# Patient Record
Sex: Female | Born: 1950
Health system: Southern US, Community
[De-identification: ages and names within clinical notes are randomized; demographics above are authoritative.]

## PROBLEM LIST (undated history)

## (undated) DIAGNOSIS — E079 Disorder of thyroid, unspecified: Secondary | ICD-10-CM

## (undated) DIAGNOSIS — I10 Essential (primary) hypertension: Secondary | ICD-10-CM

## (undated) HISTORY — PX: BREAST BIOPSY: SHX20

## (undated) HISTORY — PX: CHOLECYSTECTOMY: SHX55

---

## 2015-05-23 DIAGNOSIS — M9901 Segmental and somatic dysfunction of cervical region: Secondary | ICD-10-CM | POA: Diagnosis not present

## 2015-05-23 DIAGNOSIS — M9903 Segmental and somatic dysfunction of lumbar region: Secondary | ICD-10-CM | POA: Diagnosis not present

## 2015-05-23 DIAGNOSIS — R51 Headache: Secondary | ICD-10-CM | POA: Diagnosis not present

## 2015-05-23 DIAGNOSIS — M5033 Other cervical disc degeneration, cervicothoracic region: Secondary | ICD-10-CM | POA: Diagnosis not present

## 2015-05-25 DIAGNOSIS — M9901 Segmental and somatic dysfunction of cervical region: Secondary | ICD-10-CM | POA: Diagnosis not present

## 2015-05-25 DIAGNOSIS — M9903 Segmental and somatic dysfunction of lumbar region: Secondary | ICD-10-CM | POA: Diagnosis not present

## 2015-05-25 DIAGNOSIS — R51 Headache: Secondary | ICD-10-CM | POA: Diagnosis not present

## 2015-05-25 DIAGNOSIS — M5033 Other cervical disc degeneration, cervicothoracic region: Secondary | ICD-10-CM | POA: Diagnosis not present

## 2015-05-26 DIAGNOSIS — R51 Headache: Secondary | ICD-10-CM | POA: Diagnosis not present

## 2015-05-26 DIAGNOSIS — M9903 Segmental and somatic dysfunction of lumbar region: Secondary | ICD-10-CM | POA: Diagnosis not present

## 2015-05-26 DIAGNOSIS — M9901 Segmental and somatic dysfunction of cervical region: Secondary | ICD-10-CM | POA: Diagnosis not present

## 2015-05-26 DIAGNOSIS — M5033 Other cervical disc degeneration, cervicothoracic region: Secondary | ICD-10-CM | POA: Diagnosis not present

## 2015-05-30 DIAGNOSIS — M9903 Segmental and somatic dysfunction of lumbar region: Secondary | ICD-10-CM | POA: Diagnosis not present

## 2015-05-30 DIAGNOSIS — R51 Headache: Secondary | ICD-10-CM | POA: Diagnosis not present

## 2015-05-30 DIAGNOSIS — M5033 Other cervical disc degeneration, cervicothoracic region: Secondary | ICD-10-CM | POA: Diagnosis not present

## 2015-05-30 DIAGNOSIS — M9901 Segmental and somatic dysfunction of cervical region: Secondary | ICD-10-CM | POA: Diagnosis not present

## 2015-06-02 DIAGNOSIS — M9903 Segmental and somatic dysfunction of lumbar region: Secondary | ICD-10-CM | POA: Diagnosis not present

## 2015-06-02 DIAGNOSIS — M9901 Segmental and somatic dysfunction of cervical region: Secondary | ICD-10-CM | POA: Diagnosis not present

## 2015-06-02 DIAGNOSIS — R51 Headache: Secondary | ICD-10-CM | POA: Diagnosis not present

## 2015-06-02 DIAGNOSIS — M5033 Other cervical disc degeneration, cervicothoracic region: Secondary | ICD-10-CM | POA: Diagnosis not present

## 2015-06-06 DIAGNOSIS — R51 Headache: Secondary | ICD-10-CM | POA: Diagnosis not present

## 2015-06-06 DIAGNOSIS — M9901 Segmental and somatic dysfunction of cervical region: Secondary | ICD-10-CM | POA: Diagnosis not present

## 2015-06-06 DIAGNOSIS — M5033 Other cervical disc degeneration, cervicothoracic region: Secondary | ICD-10-CM | POA: Diagnosis not present

## 2015-06-06 DIAGNOSIS — M9903 Segmental and somatic dysfunction of lumbar region: Secondary | ICD-10-CM | POA: Diagnosis not present

## 2015-06-08 DIAGNOSIS — R51 Headache: Secondary | ICD-10-CM | POA: Diagnosis not present

## 2015-06-08 DIAGNOSIS — M9901 Segmental and somatic dysfunction of cervical region: Secondary | ICD-10-CM | POA: Diagnosis not present

## 2015-06-08 DIAGNOSIS — M5033 Other cervical disc degeneration, cervicothoracic region: Secondary | ICD-10-CM | POA: Diagnosis not present

## 2015-06-08 DIAGNOSIS — M9903 Segmental and somatic dysfunction of lumbar region: Secondary | ICD-10-CM | POA: Diagnosis not present

## 2015-06-13 DIAGNOSIS — M5033 Other cervical disc degeneration, cervicothoracic region: Secondary | ICD-10-CM | POA: Diagnosis not present

## 2015-06-13 DIAGNOSIS — R51 Headache: Secondary | ICD-10-CM | POA: Diagnosis not present

## 2015-06-13 DIAGNOSIS — M9903 Segmental and somatic dysfunction of lumbar region: Secondary | ICD-10-CM | POA: Diagnosis not present

## 2015-06-13 DIAGNOSIS — M9901 Segmental and somatic dysfunction of cervical region: Secondary | ICD-10-CM | POA: Diagnosis not present

## 2015-06-29 DIAGNOSIS — M5033 Other cervical disc degeneration, cervicothoracic region: Secondary | ICD-10-CM | POA: Diagnosis not present

## 2015-06-29 DIAGNOSIS — M9901 Segmental and somatic dysfunction of cervical region: Secondary | ICD-10-CM | POA: Diagnosis not present

## 2015-06-29 DIAGNOSIS — R51 Headache: Secondary | ICD-10-CM | POA: Diagnosis not present

## 2015-06-29 DIAGNOSIS — M9903 Segmental and somatic dysfunction of lumbar region: Secondary | ICD-10-CM | POA: Diagnosis not present

## 2015-06-30 DIAGNOSIS — R51 Headache: Secondary | ICD-10-CM | POA: Diagnosis not present

## 2015-06-30 DIAGNOSIS — M9903 Segmental and somatic dysfunction of lumbar region: Secondary | ICD-10-CM | POA: Diagnosis not present

## 2015-06-30 DIAGNOSIS — M5033 Other cervical disc degeneration, cervicothoracic region: Secondary | ICD-10-CM | POA: Diagnosis not present

## 2015-06-30 DIAGNOSIS — M9901 Segmental and somatic dysfunction of cervical region: Secondary | ICD-10-CM | POA: Diagnosis not present

## 2015-07-04 DIAGNOSIS — M9901 Segmental and somatic dysfunction of cervical region: Secondary | ICD-10-CM | POA: Diagnosis not present

## 2015-07-04 DIAGNOSIS — M9903 Segmental and somatic dysfunction of lumbar region: Secondary | ICD-10-CM | POA: Diagnosis not present

## 2015-07-04 DIAGNOSIS — M5033 Other cervical disc degeneration, cervicothoracic region: Secondary | ICD-10-CM | POA: Diagnosis not present

## 2015-07-04 DIAGNOSIS — R51 Headache: Secondary | ICD-10-CM | POA: Diagnosis not present

## 2015-07-06 DIAGNOSIS — M5033 Other cervical disc degeneration, cervicothoracic region: Secondary | ICD-10-CM | POA: Diagnosis not present

## 2015-07-06 DIAGNOSIS — M9903 Segmental and somatic dysfunction of lumbar region: Secondary | ICD-10-CM | POA: Diagnosis not present

## 2015-07-06 DIAGNOSIS — R51 Headache: Secondary | ICD-10-CM | POA: Diagnosis not present

## 2015-07-06 DIAGNOSIS — M9901 Segmental and somatic dysfunction of cervical region: Secondary | ICD-10-CM | POA: Diagnosis not present

## 2015-07-08 DIAGNOSIS — M5033 Other cervical disc degeneration, cervicothoracic region: Secondary | ICD-10-CM | POA: Diagnosis not present

## 2015-07-08 DIAGNOSIS — M9903 Segmental and somatic dysfunction of lumbar region: Secondary | ICD-10-CM | POA: Diagnosis not present

## 2015-07-08 DIAGNOSIS — R51 Headache: Secondary | ICD-10-CM | POA: Diagnosis not present

## 2015-07-08 DIAGNOSIS — M9901 Segmental and somatic dysfunction of cervical region: Secondary | ICD-10-CM | POA: Diagnosis not present

## 2015-07-11 DIAGNOSIS — M9903 Segmental and somatic dysfunction of lumbar region: Secondary | ICD-10-CM | POA: Diagnosis not present

## 2015-07-11 DIAGNOSIS — M5033 Other cervical disc degeneration, cervicothoracic region: Secondary | ICD-10-CM | POA: Diagnosis not present

## 2015-07-11 DIAGNOSIS — R51 Headache: Secondary | ICD-10-CM | POA: Diagnosis not present

## 2015-07-11 DIAGNOSIS — M9901 Segmental and somatic dysfunction of cervical region: Secondary | ICD-10-CM | POA: Diagnosis not present

## 2015-07-14 DIAGNOSIS — M9903 Segmental and somatic dysfunction of lumbar region: Secondary | ICD-10-CM | POA: Diagnosis not present

## 2015-07-14 DIAGNOSIS — R51 Headache: Secondary | ICD-10-CM | POA: Diagnosis not present

## 2015-07-14 DIAGNOSIS — M5033 Other cervical disc degeneration, cervicothoracic region: Secondary | ICD-10-CM | POA: Diagnosis not present

## 2015-07-14 DIAGNOSIS — M9901 Segmental and somatic dysfunction of cervical region: Secondary | ICD-10-CM | POA: Diagnosis not present

## 2015-07-19 DIAGNOSIS — R51 Headache: Secondary | ICD-10-CM | POA: Diagnosis not present

## 2015-07-19 DIAGNOSIS — M5033 Other cervical disc degeneration, cervicothoracic region: Secondary | ICD-10-CM | POA: Diagnosis not present

## 2015-07-19 DIAGNOSIS — M9901 Segmental and somatic dysfunction of cervical region: Secondary | ICD-10-CM | POA: Diagnosis not present

## 2015-07-19 DIAGNOSIS — M9903 Segmental and somatic dysfunction of lumbar region: Secondary | ICD-10-CM | POA: Diagnosis not present

## 2015-07-21 DIAGNOSIS — M5033 Other cervical disc degeneration, cervicothoracic region: Secondary | ICD-10-CM | POA: Diagnosis not present

## 2015-07-21 DIAGNOSIS — M9903 Segmental and somatic dysfunction of lumbar region: Secondary | ICD-10-CM | POA: Diagnosis not present

## 2015-07-21 DIAGNOSIS — M9901 Segmental and somatic dysfunction of cervical region: Secondary | ICD-10-CM | POA: Diagnosis not present

## 2015-07-21 DIAGNOSIS — R51 Headache: Secondary | ICD-10-CM | POA: Diagnosis not present

## 2015-07-26 DIAGNOSIS — R51 Headache: Secondary | ICD-10-CM | POA: Diagnosis not present

## 2015-07-26 DIAGNOSIS — M9901 Segmental and somatic dysfunction of cervical region: Secondary | ICD-10-CM | POA: Diagnosis not present

## 2015-07-26 DIAGNOSIS — M5033 Other cervical disc degeneration, cervicothoracic region: Secondary | ICD-10-CM | POA: Diagnosis not present

## 2015-07-26 DIAGNOSIS — M9903 Segmental and somatic dysfunction of lumbar region: Secondary | ICD-10-CM | POA: Diagnosis not present

## 2015-07-28 DIAGNOSIS — M9903 Segmental and somatic dysfunction of lumbar region: Secondary | ICD-10-CM | POA: Diagnosis not present

## 2015-07-28 DIAGNOSIS — M5033 Other cervical disc degeneration, cervicothoracic region: Secondary | ICD-10-CM | POA: Diagnosis not present

## 2015-07-28 DIAGNOSIS — M9901 Segmental and somatic dysfunction of cervical region: Secondary | ICD-10-CM | POA: Diagnosis not present

## 2015-07-28 DIAGNOSIS — R51 Headache: Secondary | ICD-10-CM | POA: Diagnosis not present

## 2015-08-02 DIAGNOSIS — M9901 Segmental and somatic dysfunction of cervical region: Secondary | ICD-10-CM | POA: Diagnosis not present

## 2015-08-02 DIAGNOSIS — R51 Headache: Secondary | ICD-10-CM | POA: Diagnosis not present

## 2015-08-02 DIAGNOSIS — M9903 Segmental and somatic dysfunction of lumbar region: Secondary | ICD-10-CM | POA: Diagnosis not present

## 2015-08-02 DIAGNOSIS — M5033 Other cervical disc degeneration, cervicothoracic region: Secondary | ICD-10-CM | POA: Diagnosis not present

## 2015-08-04 DIAGNOSIS — M9901 Segmental and somatic dysfunction of cervical region: Secondary | ICD-10-CM | POA: Diagnosis not present

## 2015-08-04 DIAGNOSIS — R51 Headache: Secondary | ICD-10-CM | POA: Diagnosis not present

## 2015-08-04 DIAGNOSIS — M5033 Other cervical disc degeneration, cervicothoracic region: Secondary | ICD-10-CM | POA: Diagnosis not present

## 2015-08-04 DIAGNOSIS — M9903 Segmental and somatic dysfunction of lumbar region: Secondary | ICD-10-CM | POA: Diagnosis not present

## 2015-08-09 DIAGNOSIS — R51 Headache: Secondary | ICD-10-CM | POA: Diagnosis not present

## 2015-08-09 DIAGNOSIS — M9901 Segmental and somatic dysfunction of cervical region: Secondary | ICD-10-CM | POA: Diagnosis not present

## 2015-08-09 DIAGNOSIS — M5033 Other cervical disc degeneration, cervicothoracic region: Secondary | ICD-10-CM | POA: Diagnosis not present

## 2015-08-09 DIAGNOSIS — M9903 Segmental and somatic dysfunction of lumbar region: Secondary | ICD-10-CM | POA: Diagnosis not present

## 2015-12-13 DIAGNOSIS — R51 Headache: Secondary | ICD-10-CM | POA: Diagnosis not present

## 2015-12-13 DIAGNOSIS — M9901 Segmental and somatic dysfunction of cervical region: Secondary | ICD-10-CM | POA: Diagnosis not present

## 2015-12-13 DIAGNOSIS — M9903 Segmental and somatic dysfunction of lumbar region: Secondary | ICD-10-CM | POA: Diagnosis not present

## 2015-12-13 DIAGNOSIS — M5033 Other cervical disc degeneration, cervicothoracic region: Secondary | ICD-10-CM | POA: Diagnosis not present

## 2015-12-14 DIAGNOSIS — M62838 Other muscle spasm: Secondary | ICD-10-CM | POA: Diagnosis not present

## 2015-12-14 DIAGNOSIS — M9901 Segmental and somatic dysfunction of cervical region: Secondary | ICD-10-CM | POA: Diagnosis not present

## 2015-12-14 DIAGNOSIS — M9903 Segmental and somatic dysfunction of lumbar region: Secondary | ICD-10-CM | POA: Diagnosis not present

## 2015-12-14 DIAGNOSIS — R51 Headache: Secondary | ICD-10-CM | POA: Diagnosis not present

## 2015-12-14 DIAGNOSIS — M5416 Radiculopathy, lumbar region: Secondary | ICD-10-CM | POA: Diagnosis not present

## 2015-12-14 DIAGNOSIS — M5033 Other cervical disc degeneration, cervicothoracic region: Secondary | ICD-10-CM | POA: Diagnosis not present

## 2015-12-21 DIAGNOSIS — M9903 Segmental and somatic dysfunction of lumbar region: Secondary | ICD-10-CM | POA: Diagnosis not present

## 2015-12-21 DIAGNOSIS — M5441 Lumbago with sciatica, right side: Secondary | ICD-10-CM | POA: Diagnosis not present

## 2015-12-23 DIAGNOSIS — M9903 Segmental and somatic dysfunction of lumbar region: Secondary | ICD-10-CM | POA: Diagnosis not present

## 2015-12-23 DIAGNOSIS — M5441 Lumbago with sciatica, right side: Secondary | ICD-10-CM | POA: Diagnosis not present

## 2015-12-26 DIAGNOSIS — M25551 Pain in right hip: Secondary | ICD-10-CM | POA: Diagnosis not present

## 2015-12-26 DIAGNOSIS — M5441 Lumbago with sciatica, right side: Secondary | ICD-10-CM | POA: Diagnosis not present

## 2015-12-26 DIAGNOSIS — M9903 Segmental and somatic dysfunction of lumbar region: Secondary | ICD-10-CM | POA: Diagnosis not present

## 2015-12-28 DIAGNOSIS — M5441 Lumbago with sciatica, right side: Secondary | ICD-10-CM | POA: Diagnosis not present

## 2015-12-28 DIAGNOSIS — M9903 Segmental and somatic dysfunction of lumbar region: Secondary | ICD-10-CM | POA: Diagnosis not present

## 2015-12-28 DIAGNOSIS — M25551 Pain in right hip: Secondary | ICD-10-CM | POA: Diagnosis not present

## 2016-01-02 DIAGNOSIS — M25551 Pain in right hip: Secondary | ICD-10-CM | POA: Diagnosis not present

## 2016-01-02 DIAGNOSIS — M5441 Lumbago with sciatica, right side: Secondary | ICD-10-CM | POA: Diagnosis not present

## 2016-01-02 DIAGNOSIS — M9903 Segmental and somatic dysfunction of lumbar region: Secondary | ICD-10-CM | POA: Diagnosis not present

## 2016-01-04 DIAGNOSIS — M5441 Lumbago with sciatica, right side: Secondary | ICD-10-CM | POA: Diagnosis not present

## 2016-01-04 DIAGNOSIS — M9903 Segmental and somatic dysfunction of lumbar region: Secondary | ICD-10-CM | POA: Diagnosis not present

## 2016-01-04 DIAGNOSIS — M25551 Pain in right hip: Secondary | ICD-10-CM | POA: Diagnosis not present

## 2016-01-11 DIAGNOSIS — M9903 Segmental and somatic dysfunction of lumbar region: Secondary | ICD-10-CM | POA: Diagnosis not present

## 2016-01-11 DIAGNOSIS — M5441 Lumbago with sciatica, right side: Secondary | ICD-10-CM | POA: Diagnosis not present

## 2016-01-11 DIAGNOSIS — M25551 Pain in right hip: Secondary | ICD-10-CM | POA: Diagnosis not present

## 2016-01-18 DIAGNOSIS — M5441 Lumbago with sciatica, right side: Secondary | ICD-10-CM | POA: Diagnosis not present

## 2016-01-18 DIAGNOSIS — M25551 Pain in right hip: Secondary | ICD-10-CM | POA: Diagnosis not present

## 2016-01-18 DIAGNOSIS — M9903 Segmental and somatic dysfunction of lumbar region: Secondary | ICD-10-CM | POA: Diagnosis not present

## 2016-01-23 DIAGNOSIS — M25551 Pain in right hip: Secondary | ICD-10-CM | POA: Diagnosis not present

## 2016-01-23 DIAGNOSIS — M9903 Segmental and somatic dysfunction of lumbar region: Secondary | ICD-10-CM | POA: Diagnosis not present

## 2016-01-23 DIAGNOSIS — M5441 Lumbago with sciatica, right side: Secondary | ICD-10-CM | POA: Diagnosis not present

## 2016-01-24 DIAGNOSIS — M9903 Segmental and somatic dysfunction of lumbar region: Secondary | ICD-10-CM | POA: Diagnosis not present

## 2016-01-24 DIAGNOSIS — M25551 Pain in right hip: Secondary | ICD-10-CM | POA: Diagnosis not present

## 2016-01-24 DIAGNOSIS — M5441 Lumbago with sciatica, right side: Secondary | ICD-10-CM | POA: Diagnosis not present

## 2016-01-25 DIAGNOSIS — M9903 Segmental and somatic dysfunction of lumbar region: Secondary | ICD-10-CM | POA: Diagnosis not present

## 2016-01-25 DIAGNOSIS — M5441 Lumbago with sciatica, right side: Secondary | ICD-10-CM | POA: Diagnosis not present

## 2016-01-25 DIAGNOSIS — M25551 Pain in right hip: Secondary | ICD-10-CM | POA: Diagnosis not present

## 2016-01-27 DIAGNOSIS — M5441 Lumbago with sciatica, right side: Secondary | ICD-10-CM | POA: Diagnosis not present

## 2016-01-27 DIAGNOSIS — M25551 Pain in right hip: Secondary | ICD-10-CM | POA: Diagnosis not present

## 2016-01-27 DIAGNOSIS — M9903 Segmental and somatic dysfunction of lumbar region: Secondary | ICD-10-CM | POA: Diagnosis not present

## 2016-01-30 DIAGNOSIS — M5441 Lumbago with sciatica, right side: Secondary | ICD-10-CM | POA: Diagnosis not present

## 2016-01-30 DIAGNOSIS — M25551 Pain in right hip: Secondary | ICD-10-CM | POA: Diagnosis not present

## 2016-01-30 DIAGNOSIS — M9903 Segmental and somatic dysfunction of lumbar region: Secondary | ICD-10-CM | POA: Diagnosis not present

## 2016-02-01 DIAGNOSIS — M545 Low back pain: Secondary | ICD-10-CM | POA: Diagnosis not present

## 2016-02-01 DIAGNOSIS — Z1389 Encounter for screening for other disorder: Secondary | ICD-10-CM | POA: Diagnosis not present

## 2016-02-01 DIAGNOSIS — Z789 Other specified health status: Secondary | ICD-10-CM | POA: Diagnosis not present

## 2016-02-01 DIAGNOSIS — R7309 Other abnormal glucose: Secondary | ICD-10-CM | POA: Diagnosis not present

## 2016-02-01 DIAGNOSIS — M5441 Lumbago with sciatica, right side: Secondary | ICD-10-CM | POA: Diagnosis not present

## 2016-02-01 DIAGNOSIS — M9903 Segmental and somatic dysfunction of lumbar region: Secondary | ICD-10-CM | POA: Diagnosis not present

## 2016-02-01 DIAGNOSIS — M25551 Pain in right hip: Secondary | ICD-10-CM | POA: Diagnosis not present

## 2016-02-28 DIAGNOSIS — M5441 Lumbago with sciatica, right side: Secondary | ICD-10-CM | POA: Diagnosis not present

## 2016-02-28 DIAGNOSIS — M25551 Pain in right hip: Secondary | ICD-10-CM | POA: Diagnosis not present

## 2016-02-28 DIAGNOSIS — M9903 Segmental and somatic dysfunction of lumbar region: Secondary | ICD-10-CM | POA: Diagnosis not present

## 2016-02-29 DIAGNOSIS — M533 Sacrococcygeal disorders, not elsewhere classified: Secondary | ICD-10-CM | POA: Diagnosis not present

## 2016-02-29 DIAGNOSIS — M5126 Other intervertebral disc displacement, lumbar region: Secondary | ICD-10-CM | POA: Diagnosis not present

## 2016-02-29 DIAGNOSIS — M9973 Connective tissue and disc stenosis of intervertebral foramina of lumbar region: Secondary | ICD-10-CM | POA: Diagnosis not present

## 2016-02-29 DIAGNOSIS — M545 Low back pain: Secondary | ICD-10-CM | POA: Diagnosis not present

## 2016-03-01 DIAGNOSIS — R7309 Other abnormal glucose: Secondary | ICD-10-CM | POA: Diagnosis not present

## 2016-03-01 DIAGNOSIS — Z789 Other specified health status: Secondary | ICD-10-CM | POA: Diagnosis not present

## 2016-03-01 DIAGNOSIS — I10 Essential (primary) hypertension: Secondary | ICD-10-CM | POA: Diagnosis not present

## 2016-03-01 DIAGNOSIS — Z0000 Encounter for general adult medical examination without abnormal findings: Secondary | ICD-10-CM | POA: Diagnosis not present

## 2016-03-01 DIAGNOSIS — E785 Hyperlipidemia, unspecified: Secondary | ICD-10-CM | POA: Diagnosis not present

## 2016-03-01 DIAGNOSIS — E039 Hypothyroidism, unspecified: Secondary | ICD-10-CM | POA: Diagnosis not present

## 2016-03-01 DIAGNOSIS — E119 Type 2 diabetes mellitus without complications: Secondary | ICD-10-CM | POA: Diagnosis not present

## 2016-04-04 DIAGNOSIS — M5136 Other intervertebral disc degeneration, lumbar region: Secondary | ICD-10-CM | POA: Diagnosis not present

## 2016-04-04 DIAGNOSIS — M5416 Radiculopathy, lumbar region: Secondary | ICD-10-CM | POA: Diagnosis not present

## 2016-04-13 DIAGNOSIS — M5441 Lumbago with sciatica, right side: Secondary | ICD-10-CM | POA: Diagnosis not present

## 2016-04-13 DIAGNOSIS — M25551 Pain in right hip: Secondary | ICD-10-CM | POA: Diagnosis not present

## 2016-04-13 DIAGNOSIS — M9903 Segmental and somatic dysfunction of lumbar region: Secondary | ICD-10-CM | POA: Diagnosis not present

## 2016-05-09 DIAGNOSIS — M5136 Other intervertebral disc degeneration, lumbar region: Secondary | ICD-10-CM | POA: Diagnosis not present

## 2016-05-09 DIAGNOSIS — M5416 Radiculopathy, lumbar region: Secondary | ICD-10-CM | POA: Diagnosis not present

## 2016-06-25 DIAGNOSIS — J069 Acute upper respiratory infection, unspecified: Secondary | ICD-10-CM | POA: Diagnosis not present

## 2016-06-25 DIAGNOSIS — R7309 Other abnormal glucose: Secondary | ICD-10-CM | POA: Diagnosis not present

## 2016-11-07 DIAGNOSIS — M9903 Segmental and somatic dysfunction of lumbar region: Secondary | ICD-10-CM | POA: Diagnosis not present

## 2016-11-07 DIAGNOSIS — M5441 Lumbago with sciatica, right side: Secondary | ICD-10-CM | POA: Diagnosis not present

## 2016-11-07 DIAGNOSIS — M545 Low back pain: Secondary | ICD-10-CM | POA: Diagnosis not present

## 2016-11-07 DIAGNOSIS — M25551 Pain in right hip: Secondary | ICD-10-CM | POA: Diagnosis not present

## 2016-11-12 DIAGNOSIS — M545 Low back pain: Secondary | ICD-10-CM | POA: Diagnosis not present

## 2016-11-12 DIAGNOSIS — M9903 Segmental and somatic dysfunction of lumbar region: Secondary | ICD-10-CM | POA: Diagnosis not present

## 2016-12-04 DIAGNOSIS — M9903 Segmental and somatic dysfunction of lumbar region: Secondary | ICD-10-CM | POA: Diagnosis not present

## 2016-12-04 DIAGNOSIS — M5137 Other intervertebral disc degeneration, lumbosacral region: Secondary | ICD-10-CM | POA: Diagnosis not present

## 2016-12-10 DIAGNOSIS — M9903 Segmental and somatic dysfunction of lumbar region: Secondary | ICD-10-CM | POA: Diagnosis not present

## 2016-12-10 DIAGNOSIS — M5137 Other intervertebral disc degeneration, lumbosacral region: Secondary | ICD-10-CM | POA: Diagnosis not present

## 2016-12-10 DIAGNOSIS — M436 Torticollis: Secondary | ICD-10-CM | POA: Diagnosis not present

## 2016-12-10 DIAGNOSIS — M9901 Segmental and somatic dysfunction of cervical region: Secondary | ICD-10-CM | POA: Diagnosis not present

## 2017-03-22 DIAGNOSIS — E785 Hyperlipidemia, unspecified: Secondary | ICD-10-CM | POA: Diagnosis not present

## 2017-03-22 DIAGNOSIS — E119 Type 2 diabetes mellitus without complications: Secondary | ICD-10-CM | POA: Diagnosis not present

## 2017-03-22 DIAGNOSIS — R945 Abnormal results of liver function studies: Secondary | ICD-10-CM | POA: Diagnosis not present

## 2017-07-15 DIAGNOSIS — M5416 Radiculopathy, lumbar region: Secondary | ICD-10-CM | POA: Diagnosis not present

## 2017-07-15 DIAGNOSIS — M5136 Other intervertebral disc degeneration, lumbar region: Secondary | ICD-10-CM | POA: Diagnosis not present

## 2017-09-09 DIAGNOSIS — H3562 Retinal hemorrhage, left eye: Secondary | ICD-10-CM | POA: Diagnosis not present

## 2017-12-02 DIAGNOSIS — S39012A Strain of muscle, fascia and tendon of lower back, initial encounter: Secondary | ICD-10-CM | POA: Diagnosis not present

## 2017-12-02 DIAGNOSIS — M25511 Pain in right shoulder: Secondary | ICD-10-CM | POA: Diagnosis not present

## 2017-12-02 DIAGNOSIS — M9903 Segmental and somatic dysfunction of lumbar region: Secondary | ICD-10-CM | POA: Diagnosis not present

## 2018-03-19 DIAGNOSIS — M9903 Segmental and somatic dysfunction of lumbar region: Secondary | ICD-10-CM | POA: Diagnosis not present

## 2018-03-19 DIAGNOSIS — M5441 Lumbago with sciatica, right side: Secondary | ICD-10-CM | POA: Diagnosis not present

## 2018-03-28 DIAGNOSIS — M5441 Lumbago with sciatica, right side: Secondary | ICD-10-CM | POA: Diagnosis not present

## 2018-03-28 DIAGNOSIS — M9903 Segmental and somatic dysfunction of lumbar region: Secondary | ICD-10-CM | POA: Diagnosis not present

## 2018-05-13 DIAGNOSIS — E785 Hyperlipidemia, unspecified: Secondary | ICD-10-CM | POA: Diagnosis not present

## 2018-05-13 DIAGNOSIS — E039 Hypothyroidism, unspecified: Secondary | ICD-10-CM | POA: Diagnosis not present

## 2018-05-13 DIAGNOSIS — I10 Essential (primary) hypertension: Secondary | ICD-10-CM | POA: Diagnosis not present

## 2018-05-13 DIAGNOSIS — E119 Type 2 diabetes mellitus without complications: Secondary | ICD-10-CM | POA: Diagnosis not present

## 2018-05-17 ENCOUNTER — Emergency Department
Admission: EM | Admit: 2018-05-17 | Discharge: 2018-05-17 | Disposition: A | Discharge status: Home / Self Care | Payer: PPO | Attending: Emergency Medicine | Admitting: Emergency Medicine

## 2018-05-17 ENCOUNTER — Other Ambulatory Visit: Payer: Self-pay

## 2018-05-17 ENCOUNTER — Emergency Department: Payer: PPO

## 2018-05-17 ENCOUNTER — Encounter: Payer: Self-pay | Admitting: Emergency Medicine

## 2018-05-17 DIAGNOSIS — M5489 Other dorsalgia: Secondary | ICD-10-CM | POA: Diagnosis not present

## 2018-05-17 DIAGNOSIS — M545 Low back pain, unspecified: Secondary | ICD-10-CM

## 2018-05-17 DIAGNOSIS — R52 Pain, unspecified: Secondary | ICD-10-CM | POA: Diagnosis not present

## 2018-05-17 DIAGNOSIS — S3992XA Unspecified injury of lower back, initial encounter: Secondary | ICD-10-CM | POA: Diagnosis not present

## 2018-05-17 DIAGNOSIS — Z87891 Personal history of nicotine dependence: Secondary | ICD-10-CM | POA: Diagnosis not present

## 2018-05-17 DIAGNOSIS — M25551 Pain in right hip: Secondary | ICD-10-CM | POA: Diagnosis not present

## 2018-05-17 DIAGNOSIS — S79911A Unspecified injury of right hip, initial encounter: Secondary | ICD-10-CM | POA: Diagnosis not present

## 2018-05-17 DIAGNOSIS — E119 Type 2 diabetes mellitus without complications: Secondary | ICD-10-CM | POA: Diagnosis not present

## 2018-05-17 DIAGNOSIS — I10 Essential (primary) hypertension: Secondary | ICD-10-CM | POA: Diagnosis not present

## 2018-05-17 HISTORY — DX: Disorder of thyroid, unspecified: E07.9

## 2018-05-17 HISTORY — DX: Essential (primary) hypertension: I10

## 2018-05-17 LAB — BASIC METABOLIC PANEL
Anion gap: 8 (ref 5–15)
BUN: 21 mg/dL (ref 8–23)
CO2: 26 mmol/L (ref 22–32)
CREATININE: 1.24 mg/dL — AB (ref 0.44–1.00)
Calcium: 9.8 mg/dL (ref 8.9–10.3)
Chloride: 103 mmol/L (ref 98–111)
GFR calc Af Amer: 52 mL/min — ABNORMAL LOW (ref >60)
GFR calc non Af Amer: 45 mL/min — ABNORMAL LOW (ref >60)
Glucose, Bld: 113 mg/dL — ABNORMAL HIGH (ref 70–99)
Potassium: 3.7 mmol/L (ref 3.5–5.1)
Sodium: 137 mmol/L (ref 135–145)

## 2018-05-17 MED ORDER — MORPHINE SULFATE (PF) 4 MG/ML IV SOLN
4.0000 mg | Freq: Once | INTRAVENOUS | Status: AC
  Administered 2018-05-17: 4 mg via INTRAVENOUS
  Filled 2018-05-17: qty 1

## 2018-05-17 MED ORDER — ONDANSETRON HCL 4 MG/2ML IJ SOLN
4.0000 mg | Freq: Once | INTRAMUSCULAR | Status: AC
  Administered 2018-05-17: 4 mg via INTRAVENOUS
  Filled 2018-05-17: qty 2

## 2018-05-17 NOTE — ED Triage Notes (Signed)
Pt states in [place] prior to christmas with back pain. Last night while getting off couch she twisted and has right sided back pain. Has appt on 20th with ortho?. On gabapentin for the pain, took that and ibuprofen this am

## 2018-05-17 NOTE — Discharge Instructions (Addendum)
We discussed do not rest in bed.  Get up and move around although take it easy and be careful.  The x-rays show no fractures or any other acute problem.  Use the Tylenol extra strength 1 pill 4 times a day.  Increase the gabapentin to 1 pill 3 times a day.  After 4 days you can increase it further to 2 pills in the morning 1 pill at noon and 2 pills at night.  Careful can make you woozy.  Your kidney function is just slightly below normal.  Because of this I would limit the Motrin to 3 pills twice a day for only 2 days today and tomorrow.  After that stop the Motrin.  Please return for increasing pain or weakness.  Please follow-up with your doctor as planned.
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
## 2018-05-17 NOTE — ED Triage Notes (Signed)
Pt states she was bending over to give her dog something, and when the dog lunged at her she jerked back.

## 2018-05-26 DIAGNOSIS — M5416 Radiculopathy, lumbar region: Secondary | ICD-10-CM | POA: Diagnosis not present

## 2018-05-26 DIAGNOSIS — M5126 Other intervertebral disc displacement, lumbar region: Secondary | ICD-10-CM | POA: Diagnosis not present

## 2018-05-27 DIAGNOSIS — M9903 Segmental and somatic dysfunction of lumbar region: Secondary | ICD-10-CM | POA: Diagnosis not present

## 2018-05-27 DIAGNOSIS — S335XXA Sprain of ligaments of lumbar spine, initial encounter: Secondary | ICD-10-CM | POA: Diagnosis not present

## 2018-05-30 DIAGNOSIS — M9903 Segmental and somatic dysfunction of lumbar region: Secondary | ICD-10-CM | POA: Diagnosis not present

## 2018-05-30 DIAGNOSIS — S335XXA Sprain of ligaments of lumbar spine, initial encounter: Secondary | ICD-10-CM | POA: Diagnosis not present

## 2018-06-06 DIAGNOSIS — M5137 Other intervertebral disc degeneration, lumbosacral region: Secondary | ICD-10-CM | POA: Diagnosis not present

## 2018-06-06 DIAGNOSIS — M9901 Segmental and somatic dysfunction of cervical region: Secondary | ICD-10-CM | POA: Diagnosis not present

## 2018-06-06 DIAGNOSIS — M531 Cervicobrachial syndrome: Secondary | ICD-10-CM | POA: Diagnosis not present

## 2018-06-06 DIAGNOSIS — M9903 Segmental and somatic dysfunction of lumbar region: Secondary | ICD-10-CM | POA: Diagnosis not present

## 2018-06-11 DIAGNOSIS — M9903 Segmental and somatic dysfunction of lumbar region: Secondary | ICD-10-CM | POA: Diagnosis not present

## 2018-06-11 DIAGNOSIS — M5137 Other intervertebral disc degeneration, lumbosacral region: Secondary | ICD-10-CM | POA: Diagnosis not present

## 2018-08-01 ENCOUNTER — Other Ambulatory Visit: Payer: Self-pay | Admitting: Internal Medicine

## 2018-08-01 DIAGNOSIS — E118 Type 2 diabetes mellitus with unspecified complications: Secondary | ICD-10-CM | POA: Diagnosis not present

## 2018-08-01 DIAGNOSIS — J9811 Atelectasis: Secondary | ICD-10-CM | POA: Diagnosis not present

## 2018-08-01 DIAGNOSIS — Z1239 Encounter for other screening for malignant neoplasm of breast: Secondary | ICD-10-CM | POA: Diagnosis not present

## 2018-08-01 DIAGNOSIS — Z0000 Encounter for general adult medical examination without abnormal findings: Secondary | ICD-10-CM | POA: Diagnosis not present

## 2018-08-01 DIAGNOSIS — E039 Hypothyroidism, unspecified: Secondary | ICD-10-CM | POA: Diagnosis not present

## 2018-08-01 DIAGNOSIS — E7800 Pure hypercholesterolemia, unspecified: Secondary | ICD-10-CM | POA: Diagnosis not present

## 2018-08-01 DIAGNOSIS — N183 Chronic kidney disease, stage 3 (moderate): Secondary | ICD-10-CM | POA: Diagnosis not present

## 2018-08-01 DIAGNOSIS — Z1231 Encounter for screening mammogram for malignant neoplasm of breast: Secondary | ICD-10-CM

## 2018-08-01 DIAGNOSIS — Z79899 Other long term (current) drug therapy: Secondary | ICD-10-CM | POA: Diagnosis not present

## 2018-08-01 DIAGNOSIS — Z1211 Encounter for screening for malignant neoplasm of colon: Secondary | ICD-10-CM | POA: Diagnosis not present

## 2018-08-01 DIAGNOSIS — I10 Essential (primary) hypertension: Secondary | ICD-10-CM | POA: Diagnosis not present

## 2018-09-24 ENCOUNTER — Ambulatory Visit
Admission: RE | Admit: 2018-09-24 | Discharge: 2018-09-24 | Disposition: A | Discharge status: Home / Self Care | Payer: PPO | Source: Ambulatory Visit | Attending: Internal Medicine | Admitting: Internal Medicine

## 2018-09-24 ENCOUNTER — Other Ambulatory Visit: Payer: Self-pay

## 2018-09-24 DIAGNOSIS — Z1231 Encounter for screening mammogram for malignant neoplasm of breast: Secondary | ICD-10-CM | POA: Diagnosis not present

## 2018-09-24 DIAGNOSIS — Z79899 Other long term (current) drug therapy: Secondary | ICD-10-CM | POA: Diagnosis not present

## 2018-09-24 DIAGNOSIS — E7800 Pure hypercholesterolemia, unspecified: Secondary | ICD-10-CM | POA: Diagnosis not present

## 2018-09-24 DIAGNOSIS — E118 Type 2 diabetes mellitus with unspecified complications: Secondary | ICD-10-CM | POA: Diagnosis not present

## 2018-09-24 DIAGNOSIS — I10 Essential (primary) hypertension: Secondary | ICD-10-CM | POA: Diagnosis not present

## 2018-09-24 DIAGNOSIS — E039 Hypothyroidism, unspecified: Secondary | ICD-10-CM | POA: Diagnosis not present

## 2018-10-06 DIAGNOSIS — E039 Hypothyroidism, unspecified: Secondary | ICD-10-CM | POA: Diagnosis not present

## 2018-10-06 DIAGNOSIS — E118 Type 2 diabetes mellitus with unspecified complications: Secondary | ICD-10-CM | POA: Diagnosis not present

## 2018-10-06 DIAGNOSIS — Z79899 Other long term (current) drug therapy: Secondary | ICD-10-CM | POA: Diagnosis not present

## 2018-10-06 DIAGNOSIS — E7800 Pure hypercholesterolemia, unspecified: Secondary | ICD-10-CM | POA: Diagnosis not present

## 2018-10-06 DIAGNOSIS — N183 Chronic kidney disease, stage 3 (moderate): Secondary | ICD-10-CM | POA: Diagnosis not present

## 2018-10-08 ENCOUNTER — Other Ambulatory Visit: Payer: Self-pay | Admitting: Internal Medicine

## 2018-10-08 DIAGNOSIS — R928 Other abnormal and inconclusive findings on diagnostic imaging of breast: Secondary | ICD-10-CM

## 2018-10-09 ENCOUNTER — Other Ambulatory Visit: Payer: Self-pay

## 2018-10-09 ENCOUNTER — Ambulatory Visit
Admission: RE | Admit: 2018-10-09 | Discharge: 2018-10-09 | Disposition: A | Discharge status: Home / Self Care | Payer: PPO | Source: Ambulatory Visit | Attending: Internal Medicine | Admitting: Internal Medicine

## 2018-10-09 DIAGNOSIS — R928 Other abnormal and inconclusive findings on diagnostic imaging of breast: Secondary | ICD-10-CM | POA: Insufficient documentation

## 2018-10-09 DIAGNOSIS — N6459 Other signs and symptoms in breast: Secondary | ICD-10-CM | POA: Diagnosis not present

## 2018-11-18 DIAGNOSIS — M9903 Segmental and somatic dysfunction of lumbar region: Secondary | ICD-10-CM | POA: Diagnosis not present

## 2018-11-18 DIAGNOSIS — M9901 Segmental and somatic dysfunction of cervical region: Secondary | ICD-10-CM | POA: Diagnosis not present

## 2018-11-18 DIAGNOSIS — M531 Cervicobrachial syndrome: Secondary | ICD-10-CM | POA: Diagnosis not present

## 2018-11-18 DIAGNOSIS — M5136 Other intervertebral disc degeneration, lumbar region: Secondary | ICD-10-CM | POA: Diagnosis not present

## 2019-02-02 DIAGNOSIS — E118 Type 2 diabetes mellitus with unspecified complications: Secondary | ICD-10-CM | POA: Diagnosis not present

## 2019-02-02 DIAGNOSIS — E039 Hypothyroidism, unspecified: Secondary | ICD-10-CM | POA: Diagnosis not present

## 2019-02-02 DIAGNOSIS — E7800 Pure hypercholesterolemia, unspecified: Secondary | ICD-10-CM | POA: Diagnosis not present

## 2019-02-02 DIAGNOSIS — Z79899 Other long term (current) drug therapy: Secondary | ICD-10-CM | POA: Diagnosis not present

## 2019-02-04 DIAGNOSIS — Z1211 Encounter for screening for malignant neoplasm of colon: Secondary | ICD-10-CM | POA: Diagnosis not present

## 2019-02-04 DIAGNOSIS — E118 Type 2 diabetes mellitus with unspecified complications: Secondary | ICD-10-CM | POA: Diagnosis not present

## 2019-02-04 DIAGNOSIS — N183 Chronic kidney disease, stage 3 (moderate): Secondary | ICD-10-CM | POA: Diagnosis not present

## 2019-02-04 DIAGNOSIS — E039 Hypothyroidism, unspecified: Secondary | ICD-10-CM | POA: Diagnosis not present

## 2019-02-04 DIAGNOSIS — E7800 Pure hypercholesterolemia, unspecified: Secondary | ICD-10-CM | POA: Diagnosis not present

## 2019-02-04 DIAGNOSIS — I10 Essential (primary) hypertension: Secondary | ICD-10-CM | POA: Diagnosis not present

## 2019-02-04 DIAGNOSIS — Z0000 Encounter for general adult medical examination without abnormal findings: Secondary | ICD-10-CM | POA: Diagnosis not present

## 2019-03-04 DIAGNOSIS — M9902 Segmental and somatic dysfunction of thoracic region: Secondary | ICD-10-CM | POA: Diagnosis not present

## 2019-03-04 DIAGNOSIS — M4005 Postural kyphosis, thoracolumbar region: Secondary | ICD-10-CM | POA: Diagnosis not present

## 2019-03-04 DIAGNOSIS — M9903 Segmental and somatic dysfunction of lumbar region: Secondary | ICD-10-CM | POA: Diagnosis not present

## 2019-03-04 DIAGNOSIS — M5136 Other intervertebral disc degeneration, lumbar region: Secondary | ICD-10-CM | POA: Diagnosis not present

## 2019-04-20 DIAGNOSIS — M5382 Other specified dorsopathies, cervical region: Secondary | ICD-10-CM | POA: Diagnosis not present

## 2019-04-20 DIAGNOSIS — M9902 Segmental and somatic dysfunction of thoracic region: Secondary | ICD-10-CM | POA: Diagnosis not present

## 2019-04-20 DIAGNOSIS — M4005 Postural kyphosis, thoracolumbar region: Secondary | ICD-10-CM | POA: Diagnosis not present

## 2019-04-20 DIAGNOSIS — M9903 Segmental and somatic dysfunction of lumbar region: Secondary | ICD-10-CM | POA: Diagnosis not present

## 2019-04-20 DIAGNOSIS — M5136 Other intervertebral disc degeneration, lumbar region: Secondary | ICD-10-CM | POA: Diagnosis not present

## 2019-04-20 DIAGNOSIS — M9901 Segmental and somatic dysfunction of cervical region: Secondary | ICD-10-CM | POA: Diagnosis not present

## 2019-05-06 DIAGNOSIS — I10 Essential (primary) hypertension: Secondary | ICD-10-CM | POA: Diagnosis not present

## 2019-06-26 DIAGNOSIS — R0789 Other chest pain: Secondary | ICD-10-CM | POA: Diagnosis not present

## 2019-06-26 DIAGNOSIS — K219 Gastro-esophageal reflux disease without esophagitis: Secondary | ICD-10-CM | POA: Diagnosis not present

## 2019-07-05 ENCOUNTER — Ambulatory Visit: Payer: Self-pay

## 2019-07-05 ENCOUNTER — Ambulatory Visit: Payer: PPO | Attending: Internal Medicine

## 2019-08-17 DIAGNOSIS — M5136 Other intervertebral disc degeneration, lumbar region: Secondary | ICD-10-CM | POA: Diagnosis not present

## 2019-08-17 DIAGNOSIS — M9902 Segmental and somatic dysfunction of thoracic region: Secondary | ICD-10-CM | POA: Diagnosis not present

## 2019-08-17 DIAGNOSIS — M9903 Segmental and somatic dysfunction of lumbar region: Secondary | ICD-10-CM | POA: Diagnosis not present

## 2019-08-17 DIAGNOSIS — M9904 Segmental and somatic dysfunction of sacral region: Secondary | ICD-10-CM | POA: Diagnosis not present

## 2019-08-17 DIAGNOSIS — M542 Cervicalgia: Secondary | ICD-10-CM | POA: Diagnosis not present

## 2019-08-17 DIAGNOSIS — M9901 Segmental and somatic dysfunction of cervical region: Secondary | ICD-10-CM | POA: Diagnosis not present

## 2019-09-09 ENCOUNTER — Other Ambulatory Visit: Payer: Self-pay | Admitting: Internal Medicine

## 2019-09-09 DIAGNOSIS — Z0000 Encounter for general adult medical examination without abnormal findings: Secondary | ICD-10-CM | POA: Diagnosis not present

## 2019-09-09 DIAGNOSIS — E7800 Pure hypercholesterolemia, unspecified: Secondary | ICD-10-CM | POA: Diagnosis not present

## 2019-09-09 DIAGNOSIS — Z1231 Encounter for screening mammogram for malignant neoplasm of breast: Secondary | ICD-10-CM | POA: Diagnosis not present

## 2019-09-09 DIAGNOSIS — Z79899 Other long term (current) drug therapy: Secondary | ICD-10-CM | POA: Diagnosis not present

## 2019-09-09 DIAGNOSIS — E039 Hypothyroidism, unspecified: Secondary | ICD-10-CM | POA: Diagnosis not present

## 2019-09-09 DIAGNOSIS — K219 Gastro-esophageal reflux disease without esophagitis: Secondary | ICD-10-CM | POA: Diagnosis not present

## 2019-09-09 DIAGNOSIS — N1831 Chronic kidney disease, stage 3a: Secondary | ICD-10-CM | POA: Diagnosis not present

## 2019-09-09 DIAGNOSIS — I10 Essential (primary) hypertension: Secondary | ICD-10-CM | POA: Diagnosis not present

## 2019-09-09 DIAGNOSIS — E118 Type 2 diabetes mellitus with unspecified complications: Secondary | ICD-10-CM | POA: Diagnosis not present

## 2019-10-12 DIAGNOSIS — M9903 Segmental and somatic dysfunction of lumbar region: Secondary | ICD-10-CM | POA: Diagnosis not present

## 2019-10-12 DIAGNOSIS — M9904 Segmental and somatic dysfunction of sacral region: Secondary | ICD-10-CM | POA: Diagnosis not present

## 2019-10-12 DIAGNOSIS — M542 Cervicalgia: Secondary | ICD-10-CM | POA: Diagnosis not present

## 2019-10-12 DIAGNOSIS — M5136 Other intervertebral disc degeneration, lumbar region: Secondary | ICD-10-CM | POA: Diagnosis not present

## 2019-10-12 DIAGNOSIS — M9902 Segmental and somatic dysfunction of thoracic region: Secondary | ICD-10-CM | POA: Diagnosis not present

## 2019-10-12 DIAGNOSIS — M9901 Segmental and somatic dysfunction of cervical region: Secondary | ICD-10-CM | POA: Diagnosis not present

## 2019-10-14 ENCOUNTER — Encounter (INDEPENDENT_AMBULATORY_CARE_PROVIDER_SITE_OTHER): Payer: PPO | Admitting: Ophthalmology

## 2019-12-07 DIAGNOSIS — M4603 Spinal enthesopathy, cervicothoracic region: Secondary | ICD-10-CM | POA: Diagnosis not present

## 2019-12-07 DIAGNOSIS — S39012A Strain of muscle, fascia and tendon of lower back, initial encounter: Secondary | ICD-10-CM | POA: Diagnosis not present

## 2019-12-07 DIAGNOSIS — M9902 Segmental and somatic dysfunction of thoracic region: Secondary | ICD-10-CM | POA: Diagnosis not present

## 2019-12-07 DIAGNOSIS — M9904 Segmental and somatic dysfunction of sacral region: Secondary | ICD-10-CM | POA: Diagnosis not present

## 2019-12-07 DIAGNOSIS — M5136 Other intervertebral disc degeneration, lumbar region: Secondary | ICD-10-CM | POA: Diagnosis not present

## 2019-12-07 DIAGNOSIS — M9903 Segmental and somatic dysfunction of lumbar region: Secondary | ICD-10-CM | POA: Diagnosis not present

## 2019-12-07 DIAGNOSIS — M9901 Segmental and somatic dysfunction of cervical region: Secondary | ICD-10-CM | POA: Diagnosis not present

## 2020-02-08 ENCOUNTER — Other Ambulatory Visit: Payer: Self-pay

## 2020-02-08 ENCOUNTER — Ambulatory Visit
Admission: RE | Admit: 2020-02-08 | Discharge: 2020-02-08 | Disposition: A | Discharge status: Home / Self Care | Payer: PPO | Source: Ambulatory Visit | Attending: Internal Medicine | Admitting: Internal Medicine

## 2020-02-08 DIAGNOSIS — Z1231 Encounter for screening mammogram for malignant neoplasm of breast: Secondary | ICD-10-CM | POA: Diagnosis not present

## 2020-03-09 DIAGNOSIS — E039 Hypothyroidism, unspecified: Secondary | ICD-10-CM | POA: Diagnosis not present

## 2020-03-09 DIAGNOSIS — E7800 Pure hypercholesterolemia, unspecified: Secondary | ICD-10-CM | POA: Diagnosis not present

## 2020-03-09 DIAGNOSIS — Z79899 Other long term (current) drug therapy: Secondary | ICD-10-CM | POA: Diagnosis not present

## 2020-03-09 DIAGNOSIS — E118 Type 2 diabetes mellitus with unspecified complications: Secondary | ICD-10-CM | POA: Diagnosis not present

## 2020-03-09 DIAGNOSIS — I10 Essential (primary) hypertension: Secondary | ICD-10-CM | POA: Diagnosis not present

## 2020-03-16 DIAGNOSIS — E039 Hypothyroidism, unspecified: Secondary | ICD-10-CM | POA: Diagnosis not present

## 2020-03-16 DIAGNOSIS — Z79899 Other long term (current) drug therapy: Secondary | ICD-10-CM | POA: Diagnosis not present

## 2020-03-16 DIAGNOSIS — M79632 Pain in left forearm: Secondary | ICD-10-CM | POA: Diagnosis not present

## 2020-03-16 DIAGNOSIS — E118 Type 2 diabetes mellitus with unspecified complications: Secondary | ICD-10-CM | POA: Diagnosis not present

## 2020-03-16 DIAGNOSIS — Z1211 Encounter for screening for malignant neoplasm of colon: Secondary | ICD-10-CM | POA: Diagnosis not present

## 2020-03-16 DIAGNOSIS — E7800 Pure hypercholesterolemia, unspecified: Secondary | ICD-10-CM | POA: Diagnosis not present

## 2020-03-16 DIAGNOSIS — N1831 Chronic kidney disease, stage 3a: Secondary | ICD-10-CM | POA: Diagnosis not present

## 2020-03-16 DIAGNOSIS — I10 Essential (primary) hypertension: Secondary | ICD-10-CM | POA: Diagnosis not present

## 2020-03-23 DIAGNOSIS — M62838 Other muscle spasm: Secondary | ICD-10-CM | POA: Diagnosis not present

## 2020-03-23 DIAGNOSIS — M25522 Pain in left elbow: Secondary | ICD-10-CM | POA: Diagnosis not present

## 2020-04-13 DIAGNOSIS — B9689 Other specified bacterial agents as the cause of diseases classified elsewhere: Secondary | ICD-10-CM | POA: Diagnosis not present

## 2020-04-13 DIAGNOSIS — Z03818 Encounter for observation for suspected exposure to other biological agents ruled out: Secondary | ICD-10-CM | POA: Diagnosis not present

## 2020-04-13 DIAGNOSIS — J019 Acute sinusitis, unspecified: Secondary | ICD-10-CM | POA: Diagnosis not present

## 2020-04-13 DIAGNOSIS — J209 Acute bronchitis, unspecified: Secondary | ICD-10-CM | POA: Diagnosis not present

## 2020-09-06 DIAGNOSIS — M9903 Segmental and somatic dysfunction of lumbar region: Secondary | ICD-10-CM | POA: Diagnosis not present

## 2020-09-06 DIAGNOSIS — S39012A Strain of muscle, fascia and tendon of lower back, initial encounter: Secondary | ICD-10-CM | POA: Diagnosis not present

## 2020-09-06 DIAGNOSIS — M4603 Spinal enthesopathy, cervicothoracic region: Secondary | ICD-10-CM | POA: Diagnosis not present

## 2020-09-06 DIAGNOSIS — M9902 Segmental and somatic dysfunction of thoracic region: Secondary | ICD-10-CM | POA: Diagnosis not present

## 2020-09-06 DIAGNOSIS — M5136 Other intervertebral disc degeneration, lumbar region: Secondary | ICD-10-CM | POA: Diagnosis not present

## 2020-09-06 DIAGNOSIS — M9901 Segmental and somatic dysfunction of cervical region: Secondary | ICD-10-CM | POA: Diagnosis not present

## 2020-09-06 DIAGNOSIS — M9904 Segmental and somatic dysfunction of sacral region: Secondary | ICD-10-CM | POA: Diagnosis not present

## 2020-09-07 DIAGNOSIS — M9902 Segmental and somatic dysfunction of thoracic region: Secondary | ICD-10-CM | POA: Diagnosis not present

## 2020-09-07 DIAGNOSIS — E7800 Pure hypercholesterolemia, unspecified: Secondary | ICD-10-CM | POA: Diagnosis not present

## 2020-09-07 DIAGNOSIS — M9901 Segmental and somatic dysfunction of cervical region: Secondary | ICD-10-CM | POA: Diagnosis not present

## 2020-09-07 DIAGNOSIS — Z79899 Other long term (current) drug therapy: Secondary | ICD-10-CM | POA: Diagnosis not present

## 2020-09-07 DIAGNOSIS — S39012A Strain of muscle, fascia and tendon of lower back, initial encounter: Secondary | ICD-10-CM | POA: Diagnosis not present

## 2020-09-07 DIAGNOSIS — M5136 Other intervertebral disc degeneration, lumbar region: Secondary | ICD-10-CM | POA: Diagnosis not present

## 2020-09-07 DIAGNOSIS — M9903 Segmental and somatic dysfunction of lumbar region: Secondary | ICD-10-CM | POA: Diagnosis not present

## 2020-09-07 DIAGNOSIS — M9904 Segmental and somatic dysfunction of sacral region: Secondary | ICD-10-CM | POA: Diagnosis not present

## 2020-09-07 DIAGNOSIS — M4603 Spinal enthesopathy, cervicothoracic region: Secondary | ICD-10-CM | POA: Diagnosis not present

## 2020-09-07 DIAGNOSIS — I10 Essential (primary) hypertension: Secondary | ICD-10-CM | POA: Diagnosis not present

## 2020-09-07 DIAGNOSIS — E039 Hypothyroidism, unspecified: Secondary | ICD-10-CM | POA: Diagnosis not present

## 2020-09-07 DIAGNOSIS — E118 Type 2 diabetes mellitus with unspecified complications: Secondary | ICD-10-CM | POA: Diagnosis not present

## 2020-09-12 DIAGNOSIS — S39012A Strain of muscle, fascia and tendon of lower back, initial encounter: Secondary | ICD-10-CM | POA: Diagnosis not present

## 2020-09-12 DIAGNOSIS — M9901 Segmental and somatic dysfunction of cervical region: Secondary | ICD-10-CM | POA: Diagnosis not present

## 2020-09-12 DIAGNOSIS — M5136 Other intervertebral disc degeneration, lumbar region: Secondary | ICD-10-CM | POA: Diagnosis not present

## 2020-09-12 DIAGNOSIS — M9903 Segmental and somatic dysfunction of lumbar region: Secondary | ICD-10-CM | POA: Diagnosis not present

## 2020-09-12 DIAGNOSIS — M9902 Segmental and somatic dysfunction of thoracic region: Secondary | ICD-10-CM | POA: Diagnosis not present

## 2020-09-12 DIAGNOSIS — M4603 Spinal enthesopathy, cervicothoracic region: Secondary | ICD-10-CM | POA: Diagnosis not present

## 2020-09-12 DIAGNOSIS — M9904 Segmental and somatic dysfunction of sacral region: Secondary | ICD-10-CM | POA: Diagnosis not present

## 2020-09-14 DIAGNOSIS — E7800 Pure hypercholesterolemia, unspecified: Secondary | ICD-10-CM | POA: Diagnosis not present

## 2020-09-14 DIAGNOSIS — E039 Hypothyroidism, unspecified: Secondary | ICD-10-CM | POA: Diagnosis not present

## 2020-09-14 DIAGNOSIS — E118 Type 2 diabetes mellitus with unspecified complications: Secondary | ICD-10-CM | POA: Diagnosis not present

## 2020-09-14 DIAGNOSIS — L989 Disorder of the skin and subcutaneous tissue, unspecified: Secondary | ICD-10-CM | POA: Diagnosis not present

## 2020-09-14 DIAGNOSIS — I10 Essential (primary) hypertension: Secondary | ICD-10-CM | POA: Diagnosis not present

## 2020-09-14 DIAGNOSIS — Z79899 Other long term (current) drug therapy: Secondary | ICD-10-CM | POA: Diagnosis not present

## 2020-09-14 DIAGNOSIS — Z0000 Encounter for general adult medical examination without abnormal findings: Secondary | ICD-10-CM | POA: Diagnosis not present

## 2020-09-14 DIAGNOSIS — N1832 Chronic kidney disease, stage 3b: Secondary | ICD-10-CM | POA: Diagnosis not present

## 2020-09-14 DIAGNOSIS — Z1211 Encounter for screening for malignant neoplasm of colon: Secondary | ICD-10-CM | POA: Diagnosis not present

## 2020-10-10 DIAGNOSIS — M4603 Spinal enthesopathy, cervicothoracic region: Secondary | ICD-10-CM | POA: Diagnosis not present

## 2020-10-10 DIAGNOSIS — M9902 Segmental and somatic dysfunction of thoracic region: Secondary | ICD-10-CM | POA: Diagnosis not present

## 2020-10-10 DIAGNOSIS — M5136 Other intervertebral disc degeneration, lumbar region: Secondary | ICD-10-CM | POA: Diagnosis not present

## 2020-10-10 DIAGNOSIS — M9904 Segmental and somatic dysfunction of sacral region: Secondary | ICD-10-CM | POA: Diagnosis not present

## 2020-10-10 DIAGNOSIS — S39012A Strain of muscle, fascia and tendon of lower back, initial encounter: Secondary | ICD-10-CM | POA: Diagnosis not present

## 2020-10-10 DIAGNOSIS — M9903 Segmental and somatic dysfunction of lumbar region: Secondary | ICD-10-CM | POA: Diagnosis not present

## 2020-10-10 DIAGNOSIS — M9901 Segmental and somatic dysfunction of cervical region: Secondary | ICD-10-CM | POA: Diagnosis not present

## 2020-10-12 DIAGNOSIS — S39012A Strain of muscle, fascia and tendon of lower back, initial encounter: Secondary | ICD-10-CM | POA: Diagnosis not present

## 2020-10-12 DIAGNOSIS — M9901 Segmental and somatic dysfunction of cervical region: Secondary | ICD-10-CM | POA: Diagnosis not present

## 2020-10-12 DIAGNOSIS — M9903 Segmental and somatic dysfunction of lumbar region: Secondary | ICD-10-CM | POA: Diagnosis not present

## 2020-10-12 DIAGNOSIS — M9904 Segmental and somatic dysfunction of sacral region: Secondary | ICD-10-CM | POA: Diagnosis not present

## 2020-10-12 DIAGNOSIS — M9902 Segmental and somatic dysfunction of thoracic region: Secondary | ICD-10-CM | POA: Diagnosis not present

## 2020-10-12 DIAGNOSIS — M4603 Spinal enthesopathy, cervicothoracic region: Secondary | ICD-10-CM | POA: Diagnosis not present

## 2020-10-12 DIAGNOSIS — M5136 Other intervertebral disc degeneration, lumbar region: Secondary | ICD-10-CM | POA: Diagnosis not present

## 2021-03-08 ENCOUNTER — Ambulatory Visit: Payer: PPO | Admitting: Dermatology

## 2021-03-08 ENCOUNTER — Other Ambulatory Visit: Payer: Self-pay

## 2021-03-08 DIAGNOSIS — L578 Other skin changes due to chronic exposure to nonionizing radiation: Secondary | ICD-10-CM

## 2021-03-08 DIAGNOSIS — L821 Other seborrheic keratosis: Secondary | ICD-10-CM | POA: Diagnosis not present

## 2021-03-08 DIAGNOSIS — L918 Other hypertrophic disorders of the skin: Secondary | ICD-10-CM

## 2021-03-08 DIAGNOSIS — L820 Inflamed seborrheic keratosis: Secondary | ICD-10-CM

## 2021-03-08 NOTE — Progress Notes (Signed)
New Patient Visit  Subjective  Emily Price is a 70 y.o. female who presents for the following: Skin Problem (New pt c/o irritated tags on her neck, she would like removed today ). She has several other irritating lesions on her neck and below her breasts she would like evaluated today. She has several other areas to be evaluated today.  The following portions of the chart were reviewed this encounter and updated as appropriate:   Tobacco  Allergies  Meds  Problems  Med Hx  Surg Hx  Fam Hx     Review of Systems:  No other skin or systemic complaints except as noted in HPI or Assessment and Plan.  Objective  Well appearing patient in no apparent distress; mood and affect are within normal limits.  A focused examination was performed including chest, axillae, abdomen, back, and buttocks and neck. Relevant physical exam findings are noted in the Assessment and Plan.  Neck x 14 (21) Fleshy, skin-colored pedunculated papules.    neck x 5 Erythematous keratotic or waxy stuck-on papule or plaque.    Assessment & Plan  Skin tag (21) Neck (LN2 x 14; Snip excision x 7)  Destruction of lesion - Neck x 14 Complexity: simple   Destruction method: cryotherapy   Informed consent: discussed and consent obtained   Timeout:  patient name, date of birth, surgical site, and procedure verified Lesion destroyed using liquid nitrogen: Yes   Region frozen until ice ball extended beyond lesion: Yes   Outcome: patient tolerated procedure well with no complications   Post-procedure details: wound care instructions given    Inflamed seborrheic keratosis neck x 5 Reassured benign age-related growth.  Recommend observation.  Discussed cryotherapy if spot(s) become irritated or inflamed.   Destruction of lesion - neck x 5 Complexity: simple   Destruction method: cryotherapy   Informed consent: discussed and consent obtained   Timeout:  patient name, date of birth, surgical site, and procedure  verified Lesion destroyed using liquid nitrogen: Yes   Region frozen until ice ball extended beyond lesion: Yes   Outcome: patient tolerated procedure well with no complications   Post-procedure details: wound care instructions given    Acrochordons (Skin Tags) - Removal desired by patient due to irritation and trauma. - Fleshy, skin-colored pedunculated papules - Benign appearing.  - Patient desires removal. Reviewed that this is not covered by insurance and they will be charged a cosmetic fee for removal. Patient signed non-covered consent.  - Prior to the procedure, reviewed the expected small wound. Also reviewed the risk of leaving a small scar and the small risk of infection.  PROCEDURE - The areas were prepped with isopropyl alcohol. A small amount of lidocaine 1% with epinephrine was injected at the base of each lesion to achieve good local anesthesia. The skin tags were removed using a snip technique. Aluminum chloride and electrocautery was used for hemostasis. Petrolatum and a bandage were applied. The procedure was tolerated well. - Wound care was reviewed with the patient. They were advised to call with any concerns. Total number of treated acrochordons 7   Seborrheic Keratoses - Stuck-on, waxy, tan-brown papules and/or plaques  - Benign-appearing - Discussed benign etiology and prognosis. - Observe - Call for any changes  Acrochordons (Skin Tags) Beneath breast  - Fleshy, skin-colored pedunculated papules - Benign appearing.  - Observe. - If desired, they can be removed with an in office procedure that is not covered by insurance. - Please call the clinic if  you notice any new or changing lesions.   Actinic Damage - chronic, secondary to cumulative UV radiation exposure/sun exposure over time - diffuse scaly erythematous macules with underlying dyspigmentation - Recommend daily broad spectrum sunscreen SPF 30+ to sun-exposed areas, reapply every 2 hours as needed.  -  Recommend staying in the shade or wearing long sleeves, sun glasses (UVA+UVB protection) and wide brim hats (4-inch brim around the entire circumference of the hat). - Call for new or changing lesions.  Return if symptoms worsen or fail to improve. To have skin tags removed beneath breast  I, Angelique Holm, CMA, am acting as scribe for Armida Sans, MD .  Documentation: I have reviewed the above documentation for accuracy and completeness, and I agree with the above.  Armida Sans, MD

## 2021-03-08 NOTE — Patient Instructions (Addendum)
Cryotherapy Aftercare  Wash gently with soap and water everyday.   Apply Vaseline and Band-Aid daily until healed.  Prior to procedure, discussed risks of blister formation, small wound, skin dyspigmentation, or rare scar following cryotherapy. Recommend Vaseline ointment to treated areas while healing.    If you have any questions or concerns for your doctor, please call our main line at [phone] and press option 4 to reach your doctor's medical assistant. If no one answers, please leave a voicemail as directed and we will return your call as soon as possible. Messages left after 4 pm will be answered the following business day.   You may also send us a message via MyChart. We typically respond to MyChart messages within 1-2 business days.  For prescription refills, please ask your pharmacy to contact our office. Our fax number is [phone].  If you have an urgent issue when the clinic is closed that cannot wait until the next business day, you can page your doctor at the number below.    Please note that while we do our best to be available for urgent issues outside of office hours, we are not available 24/7.   If you have an urgent issue and are unable to reach us, you may choose to seek medical care at your doctor's office, retail clinic, urgent care center, or emergency room.  If you have a medical emergency, please immediately call 911 or go to the emergency department.  Pager Numbers  - Dr. Kowalski: [phone]  - Dr. Moye: [phone]  - Dr. Stewart: [phone]  In the event of inclement weather, please call our main line at [phone] for an update on the status of any delays or closures.  Dermatology Medication Tips: Please keep the boxes that topical medications come in in order to help keep track of the instructions about where and how to use these. Pharmacies typically print the medication instructions only on the boxes and not directly on the medication  tubes.   If your medication is too expensive, please contact our office at [phone] option 4 or send us a message through MyChart.   We are unable to tell what your co-pay for medications will be in advance as this is different depending on your insurance coverage. However, we may be able to find a substitute medication at lower cost or fill out paperwork to get insurance to cover a needed medication.   If a prior authorization is required to get your medication covered by your insurance company, please allow us 1-2 business days to complete this process.  Drug prices often vary depending on where the prescription is filled and some pharmacies may offer cheaper prices.  The website www.goodrx.com contains coupons for medications through different pharmacies. The prices here do not account for what the cost may be with help from insurance (it may be cheaper with your insurance), but the website can give you the price if you did not use any insurance.  - You can print the associated coupon and take it with your prescription to the pharmacy.  - You may also stop by our office during regular business hours and pick up a GoodRx coupon card.  - If you need your prescription sent electronically to a different pharmacy, notify our office through [place] MyChart or by phone at [phone] option 4.  

## 2021-03-10 ENCOUNTER — Encounter: Payer: Self-pay | Admitting: Dermatology

## 2021-03-10 DIAGNOSIS — E039 Hypothyroidism, unspecified: Secondary | ICD-10-CM | POA: Diagnosis not present

## 2021-03-10 DIAGNOSIS — Z79899 Other long term (current) drug therapy: Secondary | ICD-10-CM | POA: Diagnosis not present

## 2021-03-10 DIAGNOSIS — E118 Type 2 diabetes mellitus with unspecified complications: Secondary | ICD-10-CM | POA: Diagnosis not present

## 2021-03-10 DIAGNOSIS — I10 Essential (primary) hypertension: Secondary | ICD-10-CM | POA: Diagnosis not present

## 2021-03-10 DIAGNOSIS — E7800 Pure hypercholesterolemia, unspecified: Secondary | ICD-10-CM | POA: Diagnosis not present

## 2021-03-13 DIAGNOSIS — M5136 Other intervertebral disc degeneration, lumbar region: Secondary | ICD-10-CM | POA: Diagnosis not present

## 2021-03-13 DIAGNOSIS — M5387 Other specified dorsopathies, lumbosacral region: Secondary | ICD-10-CM | POA: Diagnosis not present

## 2021-03-13 DIAGNOSIS — M9901 Segmental and somatic dysfunction of cervical region: Secondary | ICD-10-CM | POA: Diagnosis not present

## 2021-03-13 DIAGNOSIS — M9903 Segmental and somatic dysfunction of lumbar region: Secondary | ICD-10-CM | POA: Diagnosis not present

## 2021-03-13 DIAGNOSIS — M9902 Segmental and somatic dysfunction of thoracic region: Secondary | ICD-10-CM | POA: Diagnosis not present

## 2021-03-13 DIAGNOSIS — M9904 Segmental and somatic dysfunction of sacral region: Secondary | ICD-10-CM | POA: Diagnosis not present

## 2021-03-17 DIAGNOSIS — Z1231 Encounter for screening mammogram for malignant neoplasm of breast: Secondary | ICD-10-CM | POA: Diagnosis not present

## 2021-03-17 DIAGNOSIS — M79602 Pain in left arm: Secondary | ICD-10-CM | POA: Diagnosis not present

## 2021-03-17 DIAGNOSIS — E7800 Pure hypercholesterolemia, unspecified: Secondary | ICD-10-CM | POA: Diagnosis not present

## 2021-03-17 DIAGNOSIS — E039 Hypothyroidism, unspecified: Secondary | ICD-10-CM | POA: Diagnosis not present

## 2021-03-17 DIAGNOSIS — E118 Type 2 diabetes mellitus with unspecified complications: Secondary | ICD-10-CM | POA: Diagnosis not present

## 2021-03-17 DIAGNOSIS — Z23 Encounter for immunization: Secondary | ICD-10-CM | POA: Diagnosis not present

## 2021-03-17 DIAGNOSIS — I10 Essential (primary) hypertension: Secondary | ICD-10-CM | POA: Diagnosis not present

## 2021-03-17 DIAGNOSIS — Z79899 Other long term (current) drug therapy: Secondary | ICD-10-CM | POA: Diagnosis not present

## 2021-03-17 DIAGNOSIS — N183 Chronic kidney disease, stage 3 unspecified: Secondary | ICD-10-CM | POA: Diagnosis not present

## 2021-04-17 DIAGNOSIS — M7522 Bicipital tendinitis, left shoulder: Secondary | ICD-10-CM | POA: Diagnosis not present

## 2021-04-17 DIAGNOSIS — M7582 Other shoulder lesions, left shoulder: Secondary | ICD-10-CM | POA: Diagnosis not present

## 2021-04-17 DIAGNOSIS — M25512 Pain in left shoulder: Secondary | ICD-10-CM | POA: Diagnosis not present

## 2021-04-17 DIAGNOSIS — M7502 Adhesive capsulitis of left shoulder: Secondary | ICD-10-CM | POA: Diagnosis not present

## 2021-10-10 ENCOUNTER — Other Ambulatory Visit: Payer: Self-pay | Admitting: Internal Medicine

## 2021-10-10 DIAGNOSIS — Z1231 Encounter for screening mammogram for malignant neoplasm of breast: Secondary | ICD-10-CM

## 2021-11-02 ENCOUNTER — Ambulatory Visit
Admission: RE | Admit: 2021-11-02 | Discharge: 2021-11-02 | Disposition: A | Discharge status: Home / Self Care | Payer: PPO | Source: Ambulatory Visit | Attending: Internal Medicine | Admitting: Internal Medicine

## 2021-11-02 DIAGNOSIS — Z1231 Encounter for screening mammogram for malignant neoplasm of breast: Secondary | ICD-10-CM | POA: Insufficient documentation

## 2022-03-11 ENCOUNTER — Emergency Department: Payer: PPO

## 2022-03-11 ENCOUNTER — Other Ambulatory Visit: Payer: Self-pay

## 2022-03-11 ENCOUNTER — Emergency Department
Admission: EM | Admit: 2022-03-11 | Discharge: 2022-03-11 | Disposition: A | Discharge status: Home / Self Care | Payer: PPO | Attending: Emergency Medicine | Admitting: Emergency Medicine

## 2022-03-11 DIAGNOSIS — W010XXA Fall on same level from slipping, tripping and stumbling without subsequent striking against object, initial encounter: Secondary | ICD-10-CM | POA: Insufficient documentation

## 2022-03-11 DIAGNOSIS — M545 Low back pain, unspecified: Secondary | ICD-10-CM | POA: Diagnosis present

## 2022-03-11 DIAGNOSIS — Y998 Other external cause status: Secondary | ICD-10-CM | POA: Insufficient documentation

## 2022-03-11 DIAGNOSIS — Y93E9 Activity, other interior property and clothing maintenance: Secondary | ICD-10-CM | POA: Insufficient documentation

## 2022-03-11 DIAGNOSIS — M25551 Pain in right hip: Secondary | ICD-10-CM | POA: Diagnosis not present

## 2022-03-11 DIAGNOSIS — Y92008 Other place in unspecified non-institutional (private) residence as the place of occurrence of the external cause: Secondary | ICD-10-CM | POA: Insufficient documentation

## 2022-03-11 MED ORDER — DIAZEPAM 2 MG PO TABS
2.0000 mg | ORAL_TABLET | Freq: Once | ORAL | Status: AC
  Administered 2022-03-11: 2 mg via ORAL
  Filled 2022-03-11: qty 1

## 2022-03-11 MED ORDER — DIAZEPAM 2 MG PO TABS: 2.0000 mg | ORAL_TABLET | Freq: Three times a day (TID) | ORAL | 0 refills | Status: AC | PRN

## 2022-03-11 MED ORDER — NAPROXEN 500 MG PO TABS: 500.0000 mg | ORAL_TABLET | Freq: Two times a day (BID) | ORAL | 0 refills | Status: AC

## 2022-03-11 MED ORDER — NAPROXEN 500 MG PO TABS
500.0000 mg | ORAL_TABLET | Freq: Once | ORAL | Status: AC
  Administered 2022-03-11: 500 mg via ORAL
  Filled 2022-03-11: qty 1

## 2022-03-11 NOTE — ED Triage Notes (Signed)
Pt presents via POV c/o right sided hip and back pain after mechanical fall. Denies LOC. Denies taking anticoagulations. 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 Ambulatory into ED entrance using cane.

## 2022-03-12 NOTE — ED Provider Notes (Signed)
[facility] Provider Note    None    (approximate)   History   Fall   HPI  Emily Price is a 71 y.o. female here presents to the emergency department for treatment and evaluation after falling backward over a rug that was rolled up and hit her back.  She was painting and started to step backward which caused her to fall.  She landed on the rug and did not hit her head or experience loss of consciousness. She has no neck or back pain.      Physical Exam   Triage Vital Signs: ED Triage Vitals  Enc Vitals Group     BP 03/11/22 0900 139/63     Pulse Rate 03/11/22 0900 (!) 55     Resp 03/11/22 0900 16     Temp 03/11/22 0900 97.8 F (36.6 C)     Temp Source 03/11/22 0900 Oral     SpO2 03/11/22 0900 100 %     Weight 03/11/22 0900 200 lb (90.7 kg)     Height 03/11/22 0900 5\' 4"  (1.626 m)     Head Circumference --      Peak Flow --      Pain Score 03/11/22 0856 3     Pain Loc --      Pain Edu? --      Excl. in [place]? --     Most recent vital signs: Vitals:   03/11/22 0900  BP: 139/63  Pulse: (!) 55  Resp: 16  Temp: 97.8 F (36.6 C)  SpO2: 100%     General: Awake, no distress.  CV:  Good peripheral perfusion.  Resp:  Normal effort.  Abd:  No distention.  Other:  No shortening or rotation of the RLE   ED Results / Procedures / Treatments   Labs (all labs ordered are listed, but only abnormal results are displayed) Labs Reviewed - No data to display   EKG  Not indicated.   RADIOLOGY  Images of the right hip negative for acute concerns.  Image interpreted and viewed by me.  Radiology report consistent with the same.   PROCEDURES:  Critical Care performed: No  Procedures   MEDICATIONS ORDERED IN ED: Medications  diazepam (VALIUM) tablet 2 mg (2 mg Oral Given 03/11/22 1005)  naproxen (NAPROSYN) tablet 500 mg (500 mg Oral Given 03/11/22 1005)     IMPRESSION / MDM / ASSESSMENT AND PLAN / ED COURSE  I reviewed the triage  vital signs and the nursing notes.                              Differential diagnosis includes, but is not limited to, right hip contusion, right hip fracture, pelvic fracture  Patient's presentation is most consistent with acute complicated illness / injury requiring diagnostic workup.  70 year old female presenting to the emergency department for evaluation of right hip and lower extremity pain after falling backwards on a rolled carpet while painting.  X-rays negative for fracture.  She has no shortening or rotation of the right lower extremity.  She has no pain with internal rotation of the hip. Pain is described as a "hard spasm" in the hip/lower back.  After 2 mg of Valium and Naprosyn was given, patient was able to stand and bear weight.  She denied feeling excessively drowsy or dizzy with this dosage..  These medications will be submitted to her pharmacy.  She was  encouraged to only take Valium as needed for severe pain. She is to follow up with primary care for symptoms not improving or return to the ER for symptoms that are worsening. Marland Kitchen     FINAL CLINICAL IMPRESSION(S) / ED DIAGNOSES   Final diagnoses:  Acute right-sided low back pain without sciatica     Rx / DC Orders   ED Discharge Orders          Ordered    diazepam (VALIUM) 2 MG tablet  Every 8 hours PRN        03/11/22 1059    naproxen (NAPROSYN) 500 MG tablet  2 times daily with meals        03/11/22 1101             Note:  This document was prepared using Dragon voice recognition software and may include unintentional dictation errors.   Victorino Dike, FNP 03/12/22 1930    Naaman Plummer, MD 03/13/22 1504

## 2022-06-06 DIAGNOSIS — E039 Hypothyroidism, unspecified: Secondary | ICD-10-CM | POA: Diagnosis not present

## 2022-07-10 ENCOUNTER — Emergency Department: Payer: PPO

## 2022-07-10 ENCOUNTER — Other Ambulatory Visit: Payer: Self-pay

## 2022-07-10 ENCOUNTER — Emergency Department
Admission: EM | Admit: 2022-07-10 | Discharge: 2022-07-10 | Disposition: A | Discharge status: Home / Self Care | Payer: PPO | Attending: Emergency Medicine | Admitting: Emergency Medicine

## 2022-07-10 DIAGNOSIS — J40 Bronchitis, not specified as acute or chronic: Secondary | ICD-10-CM | POA: Diagnosis not present

## 2022-07-10 DIAGNOSIS — R059 Cough, unspecified: Secondary | ICD-10-CM | POA: Diagnosis not present

## 2022-07-10 DIAGNOSIS — J219 Acute bronchiolitis, unspecified: Secondary | ICD-10-CM | POA: Diagnosis not present

## 2022-07-10 DIAGNOSIS — Z1152 Encounter for screening for COVID-19: Secondary | ICD-10-CM | POA: Diagnosis not present

## 2022-07-10 DIAGNOSIS — I10 Essential (primary) hypertension: Secondary | ICD-10-CM | POA: Diagnosis not present

## 2022-07-10 DIAGNOSIS — R509 Fever, unspecified: Secondary | ICD-10-CM | POA: Insufficient documentation

## 2022-07-10 DIAGNOSIS — R0602 Shortness of breath: Secondary | ICD-10-CM | POA: Diagnosis not present

## 2022-07-10 LAB — CBC
HCT: 42.5 % (ref 36.0–46.0)
Hemoglobin: 13.7 g/dL (ref 12.0–15.0)
MCH: 29.8 pg (ref 26.0–34.0)
MCHC: 32.2 g/dL (ref 30.0–36.0)
MCV: 92.4 fL (ref 80.0–100.0)
Platelets: 187 10*3/uL (ref 150–400)
RBC: 4.6 MIL/uL (ref 3.87–5.11)
RDW: 12.5 % (ref 11.5–15.5)
WBC: 3.1 10*3/uL — ABNORMAL LOW (ref 4.0–10.5)
nRBC: 0 % (ref 0.0–0.2)

## 2022-07-10 LAB — BASIC METABOLIC PANEL
Anion gap: 8 (ref 5–15)
BUN: 19 mg/dL (ref 8–23)
CO2: 25 mmol/L (ref 22–32)
Calcium: 9.2 mg/dL (ref 8.9–10.3)
Chloride: 104 mmol/L (ref 98–111)
Creatinine, Ser: 1.04 mg/dL — ABNORMAL HIGH (ref 0.44–1.00)
GFR, Estimated: 57 mL/min — ABNORMAL LOW (ref >60)
Glucose, Bld: 92 mg/dL (ref 70–99)
Potassium: 3.5 mmol/L (ref 3.5–5.1)
Sodium: 137 mmol/L (ref 135–145)

## 2022-07-10 LAB — RESP PANEL BY RT-PCR (RSV, FLU A&B, COVID)  RVPGX2
Influenza A by PCR: NEGATIVE
Influenza B by PCR: NEGATIVE
Resp Syncytial Virus by PCR: NEGATIVE
SARS Coronavirus 2 by RT PCR: NEGATIVE

## 2022-07-10 LAB — TROPONIN I (HIGH SENSITIVITY): Troponin I (High Sensitivity): 5 ng/L (ref <18)

## 2022-07-10 MED ORDER — AZITHROMYCIN 250 MG PO TABS: ORAL_TABLET | ORAL | 0 refills | Status: AC

## 2022-07-10 MED ORDER — BENZONATATE 100 MG PO CAPS: 100.0000 mg | ORAL_CAPSULE | Freq: Four times a day (QID) | ORAL | 0 refills | Status: AC | PRN

## 2022-07-10 MED ORDER — BENZONATATE 100 MG PO CAPS
100.0000 mg | ORAL_CAPSULE | Freq: Once | ORAL | Status: AC
  Administered 2022-07-10: 100 mg via ORAL
  Filled 2022-07-10: qty 1

## 2022-07-10 MED ORDER — PREDNISONE 20 MG PO TABS: 40.0000 mg | ORAL_TABLET | Freq: Every day | ORAL | 0 refills | Status: AC

## 2022-07-10 MED ORDER — AZITHROMYCIN 500 MG PO TABS
500.0000 mg | ORAL_TABLET | Freq: Once | ORAL | Status: AC
  Administered 2022-07-10: 500 mg via ORAL
  Filled 2022-07-10: qty 1

## 2022-07-10 NOTE — ED Notes (Addendum)
Pt celled by xray tech and this tech multiple times for pt to be taken for XRAY. No answer in the lobby or the lobby bathrooms.

## 2022-07-10 NOTE — ED Notes (Signed)
Patient in lobby, xray performed.

## 2022-07-10 NOTE — ED Triage Notes (Addendum)
Pt comes with c/o coughing and congestion. Pt states maybe low grade fever at home. Pt states the cold symptoms are getting worse.   Pt states sob and some pain across shoulders.

## 2022-07-10 NOTE — ED Provider Notes (Signed)
[facility] Provider Note    Event Date/Time   First MD Initiated Contact with Patient 07/10/22 2030     (approximate)  History   Chief Complaint: Cough  HPI  Emily Price is a 72 y.o. female with a past medical history of hypertension presents emergency department for cough.  According to the patient for the past 3 days she has had congestion she states subjective low-grade fever has had a worsening cough since this morning with occasional mucus production.  Denies any chest pain.  Has been using her albuterol inhaler but denies feeling overly short of breath.  Physical Exam   Triage Vital Signs: ED Triage Vitals  Enc Vitals Group     BP 07/10/22 1827 (!) 174/74     Pulse Rate 07/10/22 1827 61     Resp 07/10/22 1827 19     Temp 07/10/22 1827 97.8 F (36.6 C)     Temp Source 07/10/22 1827 Oral     SpO2 07/10/22 1827 100 %     Weight --      Height --      Head Circumference --      Peak Flow --      Pain Score 07/10/22 1826 0     Pain Loc --      Pain Edu? --      Excl. in [place]? --     Most recent vital signs: Vitals:   07/10/22 1827  BP: (!) 174/74  Pulse: 61  Resp: 19  Temp: 97.8 F (36.6 C)  SpO2: 100%    General: Awake, no distress.  Frequent cough during examination. CV:  Good peripheral perfusion.  Regular rate and rhythm  Resp:  Normal effort.  Equal breath sounds bilaterally.  No wheeze. Abd:  No distention.  Soft, nontender.  No rebound or guarding.  ED Results / Procedures / Treatments   EKG  EKG viewed and interpreted by myself shows what appears to be a sinus rhythm at 58 bpm with a narrow QRS, normal axis, normal intervals, no concerning ST changes.  RADIOLOGY  Chest x-ray viewed and interpreted by myself shows no consolidation. Radiology is read the chest x-ray is negative.   MEDICATIONS ORDERED IN ED: Medications  benzonatate (TESSALON) capsule 100 mg (has no administration in time range)     IMPRESSION /  MDM / ASSESSMENT AND PLAN / ED COURSE  I reviewed the triage vital signs and the nursing notes.  Patient's presentation is most consistent with acute presentation with potential threat to life or bodily function.  Patient presents emergency department for low-grade fever congestion and cough.  Afebrile in the emergency department hypertensive otherwise reassuring vitals.  Overall reassuring physical exam does have a frequent cough during exam but clear lung sounds with no wheeze rales or rhonchi.  Patient CBC reassuring, chemistry shows no concerning findings and troponin is negative.  Chest x-ray is clear.  EKG reassuring.  We are awaiting patient's COVID/flu/RSV swab to evaluate for possible COVID influenza or RSV.  If the swab is negative we will likely treat with antibiotics and steroids for acute bronchitis.  Patient agreeable to plan of care.  Patient's COVID/flu/RSV is negative.  Remainder the patient's workup is reassuring.  Given the patient's negative workup will cover for acute bronchitis with Zithromax place the patient on 5 days of prednisone and Tessalon Perles for cough.  Patient agreeable to plan of care and will follow-up with her doctor.   FINAL CLINICAL IMPRESSION(S) /  ED DIAGNOSES   Cough Bronchitis    Note:  This document was prepared using Dragon voice recognition software and may include unintentional dictation errors.   Harvest Dark, MD 07/10/22 2236

## 2022-07-23 DIAGNOSIS — N183 Chronic kidney disease, stage 3 unspecified: Secondary | ICD-10-CM | POA: Diagnosis not present

## 2022-07-23 DIAGNOSIS — I10 Essential (primary) hypertension: Secondary | ICD-10-CM | POA: Diagnosis not present

## 2022-07-23 DIAGNOSIS — T466X5A Adverse effect of antihyperlipidemic and antiarteriosclerotic drugs, initial encounter: Secondary | ICD-10-CM | POA: Diagnosis not present

## 2022-07-23 DIAGNOSIS — Z0000 Encounter for general adult medical examination without abnormal findings: Secondary | ICD-10-CM | POA: Diagnosis not present

## 2022-07-23 DIAGNOSIS — E7800 Pure hypercholesterolemia, unspecified: Secondary | ICD-10-CM | POA: Diagnosis not present

## 2022-07-23 DIAGNOSIS — E118 Type 2 diabetes mellitus with unspecified complications: Secondary | ICD-10-CM | POA: Diagnosis not present

## 2022-07-23 DIAGNOSIS — Z79899 Other long term (current) drug therapy: Secondary | ICD-10-CM | POA: Diagnosis not present

## 2022-07-23 DIAGNOSIS — E039 Hypothyroidism, unspecified: Secondary | ICD-10-CM | POA: Diagnosis not present

## 2022-07-23 DIAGNOSIS — G720 Drug-induced myopathy: Secondary | ICD-10-CM | POA: Diagnosis not present

## 2022-08-28 IMAGING — MG DIGITAL SCREENING BILAT W/ TOMO W/ CAD
8 of 14 series · 8 of 40 positions shown · non-contrast
Comparison: Previous exam(s).

CLINICAL DATA: Screening.

EXAM:
DIGITAL SCREENING BILATERAL MAMMOGRAM WITH TOMO AND CAD

[R CC synth-2D]
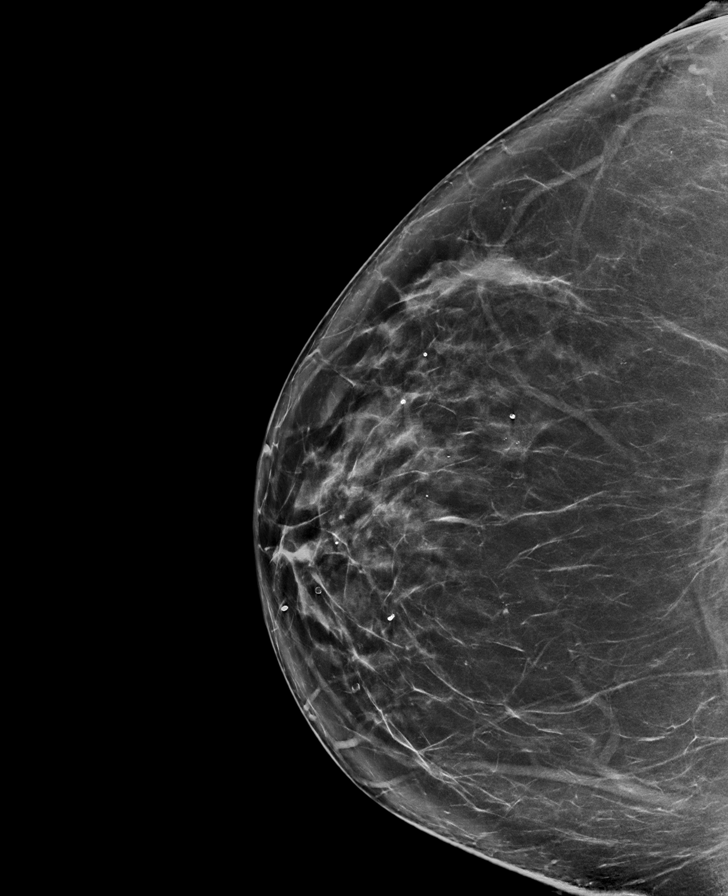

[R XCCM synth-2D]
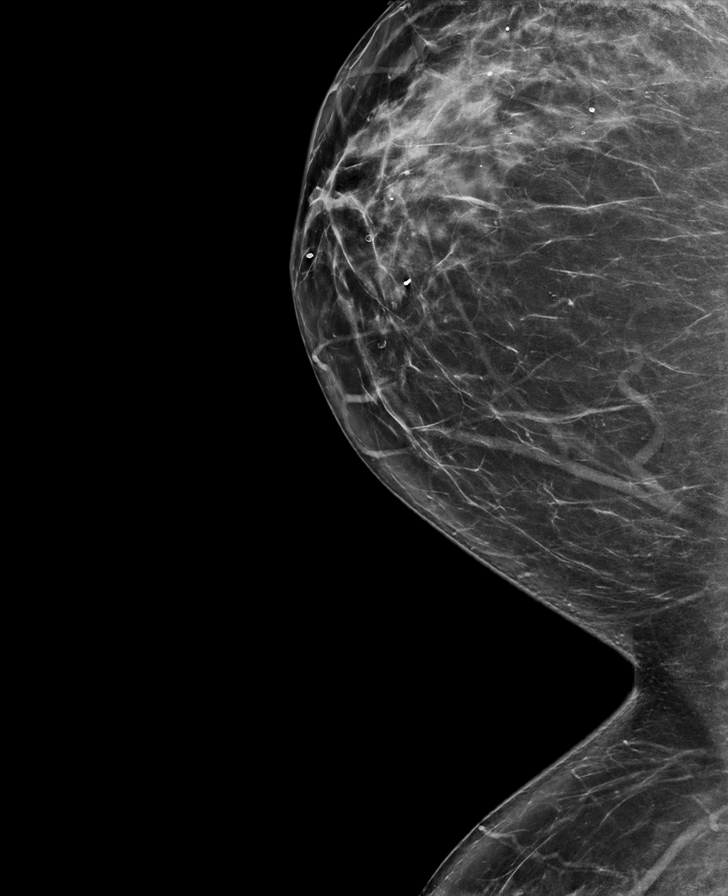

[R MLO synth-2D (1 of 2)]
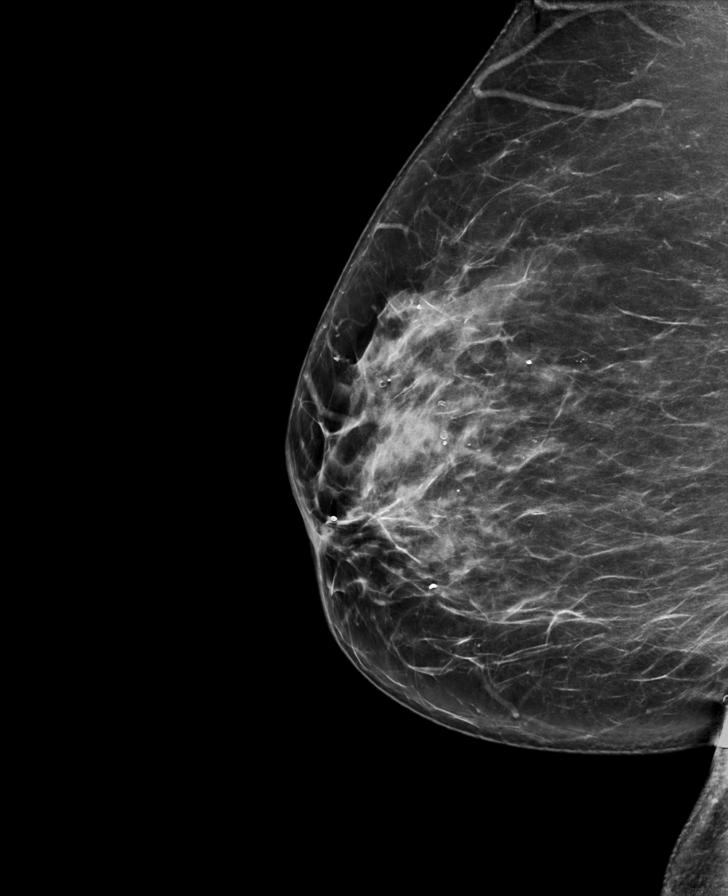

[L CC synth-2D]
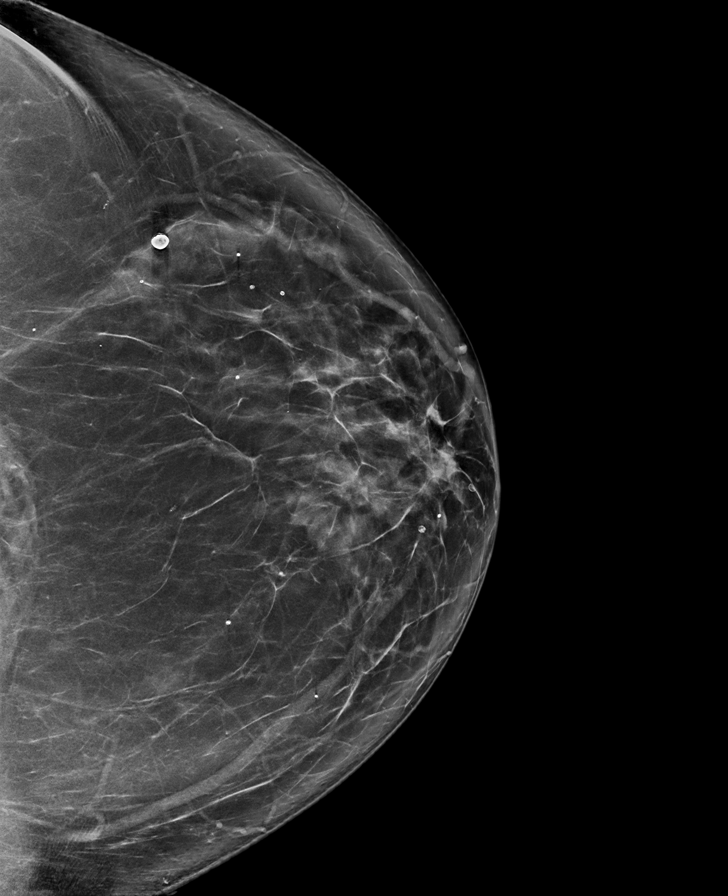

[L MLO synth-2D (1 of 2)]
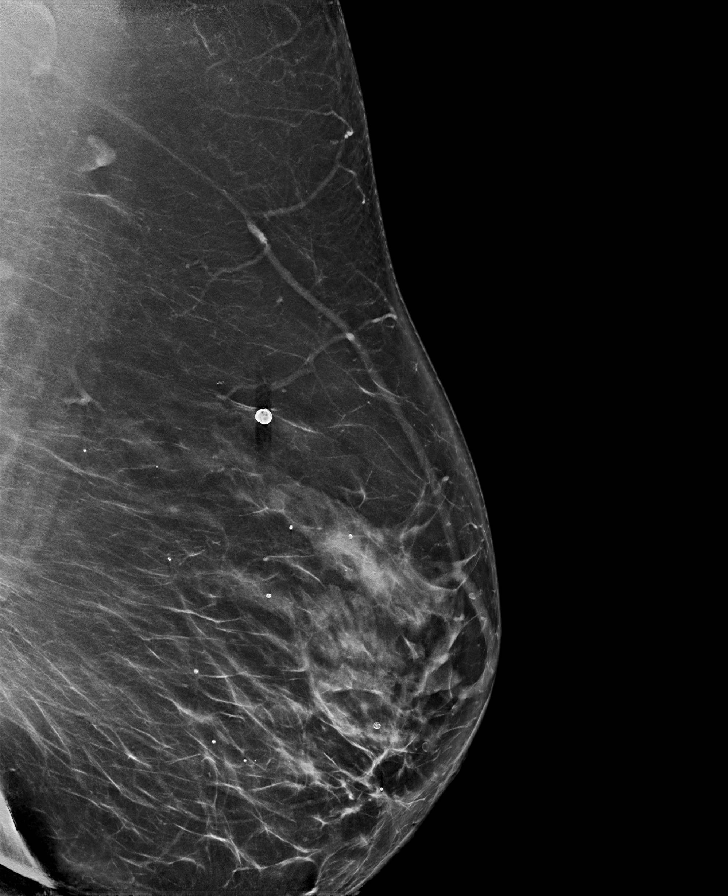

[R MLO synth-2D (2 of 2)]
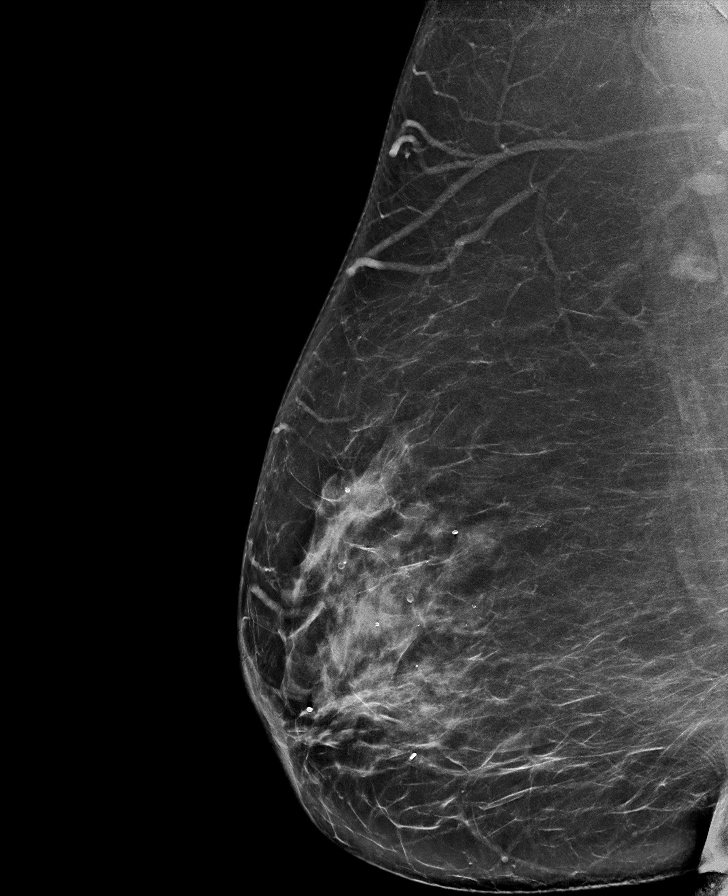

[L MLO synth-2D (2 of 2)]
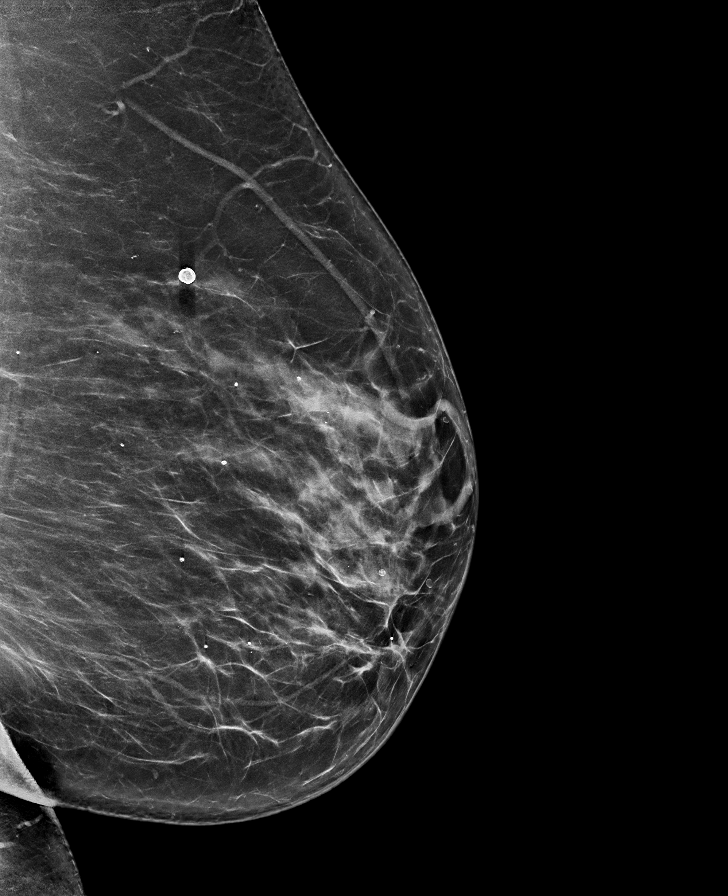

[L MLO tomo · tomo slice 43/84.0]
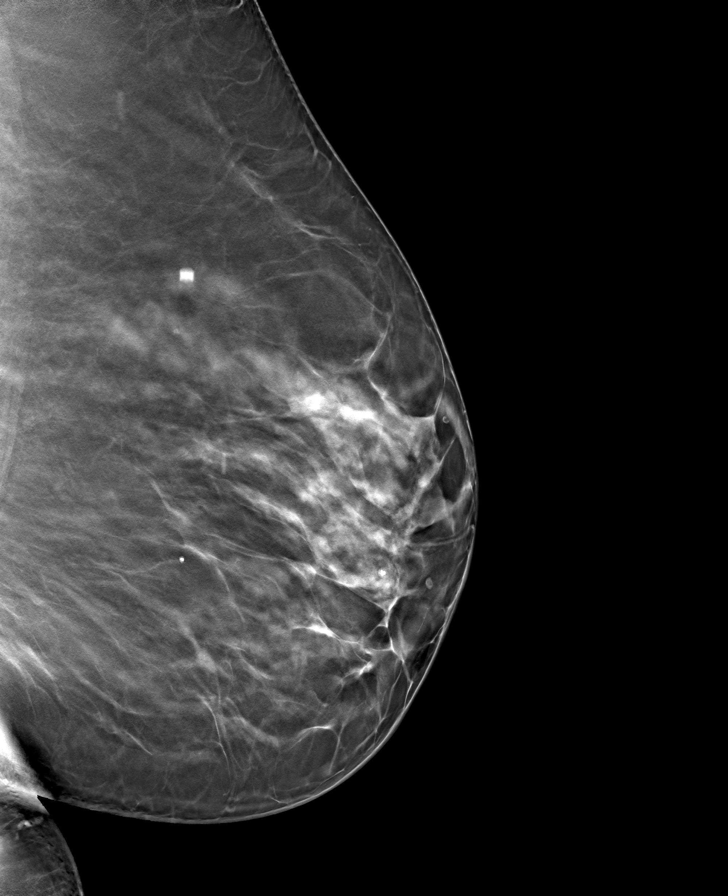

[8 of 40 positions shown; findings below may reference images not displayed]

ACR Breast Density Category c: The breast tissue is heterogeneously
dense, which may obscure small masses.
FINDINGS: There are no findings suspicious for malignancy. Images were
processed with CAD.
IMPRESSION: No mammographic evidence of malignancy. A result letter of this
screening mammogram will be mailed directly to the patient.

RECOMMENDATION:
Screening mammogram in one year. (Code:[id number])

BI-RADS CATEGORY  1: Negative.

## 2022-10-22 ENCOUNTER — Other Ambulatory Visit: Payer: Self-pay | Admitting: Internal Medicine

## 2022-10-22 DIAGNOSIS — Z1231 Encounter for screening mammogram for malignant neoplasm of breast: Secondary | ICD-10-CM

## 2022-11-05 ENCOUNTER — Ambulatory Visit
Admission: RE | Admit: 2022-11-05 | Discharge: 2022-11-05 | Disposition: A | Discharge status: Home / Self Care | Payer: PPO | Source: Ambulatory Visit | Attending: Internal Medicine | Admitting: Internal Medicine

## 2022-11-05 DIAGNOSIS — Z1231 Encounter for screening mammogram for malignant neoplasm of breast: Secondary | ICD-10-CM | POA: Diagnosis present

## 2023-05-28 DIAGNOSIS — J40 Bronchitis, not specified as acute or chronic: Secondary | ICD-10-CM | POA: Diagnosis not present

## 2023-05-28 DIAGNOSIS — I10 Essential (primary) hypertension: Secondary | ICD-10-CM | POA: Diagnosis not present

## 2023-05-28 DIAGNOSIS — E118 Type 2 diabetes mellitus with unspecified complications: Secondary | ICD-10-CM | POA: Diagnosis not present

## 2023-07-09 DIAGNOSIS — I10 Essential (primary) hypertension: Secondary | ICD-10-CM | POA: Diagnosis not present

## 2023-07-09 DIAGNOSIS — R8290 Unspecified abnormal findings in urine: Secondary | ICD-10-CM | POA: Diagnosis not present

## 2023-07-09 DIAGNOSIS — Z79899 Other long term (current) drug therapy: Secondary | ICD-10-CM | POA: Diagnosis not present

## 2023-07-09 DIAGNOSIS — E039 Hypothyroidism, unspecified: Secondary | ICD-10-CM | POA: Diagnosis not present

## 2023-07-09 DIAGNOSIS — E7800 Pure hypercholesterolemia, unspecified: Secondary | ICD-10-CM | POA: Diagnosis not present

## 2023-07-15 DIAGNOSIS — N183 Chronic kidney disease, stage 3 unspecified: Secondary | ICD-10-CM | POA: Diagnosis not present

## 2023-07-15 DIAGNOSIS — G720 Drug-induced myopathy: Secondary | ICD-10-CM | POA: Diagnosis not present

## 2023-07-15 DIAGNOSIS — E66812 Obesity, class 2: Secondary | ICD-10-CM | POA: Diagnosis not present

## 2023-07-15 DIAGNOSIS — Z1211 Encounter for screening for malignant neoplasm of colon: Secondary | ICD-10-CM | POA: Diagnosis not present

## 2023-07-15 DIAGNOSIS — E7800 Pure hypercholesterolemia, unspecified: Secondary | ICD-10-CM | POA: Diagnosis not present

## 2023-07-15 DIAGNOSIS — N1832 Chronic kidney disease, stage 3b: Secondary | ICD-10-CM | POA: Diagnosis not present

## 2023-07-15 DIAGNOSIS — Z0000 Encounter for general adult medical examination without abnormal findings: Secondary | ICD-10-CM | POA: Diagnosis not present

## 2023-07-15 DIAGNOSIS — E118 Type 2 diabetes mellitus with unspecified complications: Secondary | ICD-10-CM | POA: Diagnosis not present

## 2023-07-15 DIAGNOSIS — I10 Essential (primary) hypertension: Secondary | ICD-10-CM | POA: Diagnosis not present

## 2023-07-15 DIAGNOSIS — Z79899 Other long term (current) drug therapy: Secondary | ICD-10-CM | POA: Diagnosis not present

## 2023-07-15 DIAGNOSIS — M79601 Pain in right arm: Secondary | ICD-10-CM | POA: Diagnosis not present

## 2023-07-15 DIAGNOSIS — E039 Hypothyroidism, unspecified: Secondary | ICD-10-CM | POA: Diagnosis not present

## 2023-08-05 DIAGNOSIS — M7581 Other shoulder lesions, right shoulder: Secondary | ICD-10-CM | POA: Diagnosis not present

## 2023-08-05 DIAGNOSIS — M7521 Bicipital tendinitis, right shoulder: Secondary | ICD-10-CM | POA: Diagnosis not present

## 2023-09-04 DIAGNOSIS — M7521 Bicipital tendinitis, right shoulder: Secondary | ICD-10-CM | POA: Diagnosis not present

## 2023-09-04 DIAGNOSIS — M7581 Other shoulder lesions, right shoulder: Secondary | ICD-10-CM | POA: Diagnosis not present

## 2023-10-28 DIAGNOSIS — Z79899 Other long term (current) drug therapy: Secondary | ICD-10-CM | POA: Diagnosis not present

## 2023-10-28 DIAGNOSIS — E118 Type 2 diabetes mellitus with unspecified complications: Secondary | ICD-10-CM | POA: Diagnosis not present

## 2023-10-28 DIAGNOSIS — E039 Hypothyroidism, unspecified: Secondary | ICD-10-CM | POA: Diagnosis not present

## 2023-10-28 DIAGNOSIS — E7800 Pure hypercholesterolemia, unspecified: Secondary | ICD-10-CM | POA: Diagnosis not present

## 2023-10-28 DIAGNOSIS — I10 Essential (primary) hypertension: Secondary | ICD-10-CM | POA: Diagnosis not present

## 2023-11-04 DIAGNOSIS — Z79899 Other long term (current) drug therapy: Secondary | ICD-10-CM | POA: Diagnosis not present

## 2023-11-04 DIAGNOSIS — E118 Type 2 diabetes mellitus with unspecified complications: Secondary | ICD-10-CM | POA: Diagnosis not present

## 2023-11-04 DIAGNOSIS — Z1211 Encounter for screening for malignant neoplasm of colon: Secondary | ICD-10-CM | POA: Diagnosis not present

## 2023-11-04 DIAGNOSIS — N183 Chronic kidney disease, stage 3 unspecified: Secondary | ICD-10-CM | POA: Diagnosis not present

## 2023-11-04 DIAGNOSIS — E7800 Pure hypercholesterolemia, unspecified: Secondary | ICD-10-CM | POA: Diagnosis not present

## 2023-11-04 DIAGNOSIS — Z1231 Encounter for screening mammogram for malignant neoplasm of breast: Secondary | ICD-10-CM | POA: Diagnosis not present

## 2023-11-04 DIAGNOSIS — E039 Hypothyroidism, unspecified: Secondary | ICD-10-CM | POA: Diagnosis not present

## 2023-11-04 DIAGNOSIS — T466X5A Adverse effect of antihyperlipidemic and antiarteriosclerotic drugs, initial encounter: Secondary | ICD-10-CM | POA: Diagnosis not present

## 2023-11-04 DIAGNOSIS — I10 Essential (primary) hypertension: Secondary | ICD-10-CM | POA: Diagnosis not present

## 2023-11-04 DIAGNOSIS — E66812 Obesity, class 2: Secondary | ICD-10-CM | POA: Diagnosis not present

## 2023-11-04 DIAGNOSIS — G720 Drug-induced myopathy: Secondary | ICD-10-CM | POA: Diagnosis not present

## 2023-11-05 ENCOUNTER — Other Ambulatory Visit: Payer: Self-pay | Admitting: Internal Medicine

## 2023-11-05 DIAGNOSIS — Z1231 Encounter for screening mammogram for malignant neoplasm of breast: Secondary | ICD-10-CM

## 2023-12-12 ENCOUNTER — Ambulatory Visit
Admission: RE | Admit: 2023-12-12 | Discharge: 2023-12-12 | Disposition: A | Discharge status: Home / Self Care | Source: Ambulatory Visit | Attending: Internal Medicine | Admitting: Internal Medicine

## 2023-12-12 DIAGNOSIS — Z1231 Encounter for screening mammogram for malignant neoplasm of breast: Secondary | ICD-10-CM | POA: Insufficient documentation

## 2024-02-03 DIAGNOSIS — I10 Essential (primary) hypertension: Secondary | ICD-10-CM | POA: Diagnosis not present

## 2024-02-03 DIAGNOSIS — E039 Hypothyroidism, unspecified: Secondary | ICD-10-CM | POA: Diagnosis not present

## 2024-02-03 DIAGNOSIS — E118 Type 2 diabetes mellitus with unspecified complications: Secondary | ICD-10-CM | POA: Diagnosis not present

## 2024-02-03 DIAGNOSIS — E7800 Pure hypercholesterolemia, unspecified: Secondary | ICD-10-CM | POA: Diagnosis not present

## 2024-02-03 DIAGNOSIS — Z79899 Other long term (current) drug therapy: Secondary | ICD-10-CM | POA: Diagnosis not present
